# Patient Record
Sex: Female | Born: 1960 | Race: White | Hispanic: No | Marital: Married | Smoking: Never smoker
Health system: Southern US, Community
[De-identification: ages and names within clinical notes are randomized; demographics above are authoritative.]

## PROBLEM LIST (undated history)

## (undated) DIAGNOSIS — Z9889 Other specified postprocedural states: Secondary | ICD-10-CM

## (undated) DIAGNOSIS — I890 Lymphedema, not elsewhere classified: Secondary | ICD-10-CM

## (undated) DIAGNOSIS — E669 Obesity, unspecified: Secondary | ICD-10-CM

## (undated) DIAGNOSIS — F419 Anxiety disorder, unspecified: Secondary | ICD-10-CM

## (undated) DIAGNOSIS — K219 Gastro-esophageal reflux disease without esophagitis: Secondary | ICD-10-CM

## (undated) DIAGNOSIS — G4733 Obstructive sleep apnea (adult) (pediatric): Secondary | ICD-10-CM

## (undated) DIAGNOSIS — N183 Chronic kidney disease, stage 3 unspecified: Secondary | ICD-10-CM

## (undated) DIAGNOSIS — G2581 Restless legs syndrome: Secondary | ICD-10-CM

## (undated) DIAGNOSIS — I499 Cardiac arrhythmia, unspecified: Secondary | ICD-10-CM

## (undated) DIAGNOSIS — Z8619 Personal history of other infectious and parasitic diseases: Secondary | ICD-10-CM

## (undated) DIAGNOSIS — G43909 Migraine, unspecified, not intractable, without status migrainosus: Secondary | ICD-10-CM

## (undated) DIAGNOSIS — Z9989 Dependence on other enabling machines and devices: Secondary | ICD-10-CM

## (undated) DIAGNOSIS — D649 Anemia, unspecified: Secondary | ICD-10-CM

## (undated) DIAGNOSIS — R112 Nausea with vomiting, unspecified: Secondary | ICD-10-CM

## (undated) DIAGNOSIS — E559 Vitamin D deficiency, unspecified: Secondary | ICD-10-CM

## (undated) DIAGNOSIS — R21 Rash and other nonspecific skin eruption: Secondary | ICD-10-CM

## (undated) DIAGNOSIS — R000 Tachycardia, unspecified: Secondary | ICD-10-CM

## (undated) DIAGNOSIS — I10 Essential (primary) hypertension: Secondary | ICD-10-CM

## (undated) HISTORY — PX: FOOT SURGERY: SHX648

## (undated) HISTORY — PX: COLONOSCOPY: SHX174

## (undated) HISTORY — PX: UPPER GI ENDOSCOPY: SHX6162

## (undated) HISTORY — PX: CARDIAC CATHETERIZATION: SHX172

## (undated) HISTORY — PX: BLADDER SUSPENSION: SHX72

## (undated) HISTORY — PX: CHOLECYSTECTOMY: SHX55

## (undated) HISTORY — PX: WISDOM TOOTH EXTRACTION: SHX21

## (undated) HISTORY — PX: TONSILLECTOMY AND ADENOIDECTOMY: SUR1326

---

## 2005-10-13 ENCOUNTER — Ambulatory Visit: Payer: Self-pay | Admitting: Family Medicine

## 2006-10-21 ENCOUNTER — Ambulatory Visit: Payer: Self-pay | Admitting: Obstetrics and Gynecology

## 2007-09-10 ENCOUNTER — Emergency Department: Payer: Self-pay | Admitting: Emergency Medicine

## 2007-10-25 ENCOUNTER — Ambulatory Visit: Payer: Self-pay | Admitting: Obstetrics and Gynecology

## 2008-02-24 ENCOUNTER — Ambulatory Visit: Payer: Self-pay | Admitting: Obstetrics and Gynecology

## 2008-03-02 ENCOUNTER — Ambulatory Visit: Payer: Self-pay | Admitting: Obstetrics and Gynecology

## 2008-10-26 ENCOUNTER — Ambulatory Visit: Payer: Self-pay | Admitting: Obstetrics and Gynecology

## 2009-03-09 ENCOUNTER — Emergency Department: Payer: Self-pay | Admitting: Emergency Medicine

## 2009-10-28 ENCOUNTER — Ambulatory Visit: Payer: Self-pay | Admitting: Obstetrics and Gynecology

## 2009-11-18 ENCOUNTER — Ambulatory Visit: Payer: Self-pay | Admitting: Family Medicine

## 2010-11-06 ENCOUNTER — Ambulatory Visit: Payer: Self-pay | Admitting: Obstetrics and Gynecology

## 2011-12-08 ENCOUNTER — Ambulatory Visit: Payer: Self-pay | Admitting: Obstetrics and Gynecology

## 2013-01-26 ENCOUNTER — Ambulatory Visit: Payer: Self-pay | Admitting: Obstetrics and Gynecology

## 2013-04-24 ENCOUNTER — Ambulatory Visit: Payer: Self-pay | Admitting: Gastroenterology

## 2013-12-13 ENCOUNTER — Ambulatory Visit: Payer: Self-pay | Admitting: Family Medicine

## 2014-03-06 ENCOUNTER — Ambulatory Visit: Payer: Self-pay | Admitting: Obstetrics and Gynecology

## 2016-04-21 ENCOUNTER — Other Ambulatory Visit: Payer: Self-pay | Admitting: Obstetrics & Gynecology

## 2016-04-21 DIAGNOSIS — Z1231 Encounter for screening mammogram for malignant neoplasm of breast: Secondary | ICD-10-CM

## 2016-06-03 ENCOUNTER — Ambulatory Visit
Admission: RE | Admit: 2016-06-03 | Discharge: 2016-06-03 | Disposition: A | Discharge status: Home / Self Care | Payer: BLUE CROSS/BLUE SHIELD | Source: Ambulatory Visit | Attending: Obstetrics & Gynecology | Admitting: Obstetrics & Gynecology

## 2016-06-03 DIAGNOSIS — Z1231 Encounter for screening mammogram for malignant neoplasm of breast: Secondary | ICD-10-CM | POA: Diagnosis not present

## 2017-04-27 ENCOUNTER — Other Ambulatory Visit: Payer: Self-pay | Admitting: Obstetrics & Gynecology

## 2017-04-27 DIAGNOSIS — Z1231 Encounter for screening mammogram for malignant neoplasm of breast: Secondary | ICD-10-CM

## 2017-06-10 ENCOUNTER — Ambulatory Visit
Admission: RE | Admit: 2017-06-10 | Discharge: 2017-06-10 | Disposition: A | Discharge status: Home / Self Care | Payer: BLUE CROSS/BLUE SHIELD | Source: Ambulatory Visit | Attending: Obstetrics & Gynecology | Admitting: Obstetrics & Gynecology

## 2017-06-10 DIAGNOSIS — Z1231 Encounter for screening mammogram for malignant neoplasm of breast: Secondary | ICD-10-CM | POA: Insufficient documentation

## 2017-08-13 ENCOUNTER — Emergency Department: Payer: BLUE CROSS/BLUE SHIELD

## 2017-08-13 ENCOUNTER — Emergency Department
Admission: EM | Admit: 2017-08-13 | Discharge: 2017-08-13 | Disposition: A | Discharge status: Home / Self Care | Payer: BLUE CROSS/BLUE SHIELD | Attending: Emergency Medicine | Admitting: Emergency Medicine

## 2017-08-13 ENCOUNTER — Other Ambulatory Visit: Payer: Self-pay

## 2017-08-13 DIAGNOSIS — R079 Chest pain, unspecified: Secondary | ICD-10-CM | POA: Insufficient documentation

## 2017-08-13 DIAGNOSIS — Z5321 Procedure and treatment not carried out due to patient leaving prior to being seen by health care provider: Secondary | ICD-10-CM | POA: Diagnosis not present

## 2017-08-13 HISTORY — DX: Tachycardia, unspecified: R00.0

## 2017-08-13 HISTORY — DX: Essential (primary) hypertension: I10

## 2017-08-13 LAB — BASIC METABOLIC PANEL
Anion gap: 10 (ref 5–15)
BUN: 18 mg/dL (ref 6–20)
CALCIUM: 9.5 mg/dL (ref 8.9–10.3)
CO2: 24 mmol/L (ref 22–32)
CREATININE: 0.88 mg/dL (ref 0.44–1.00)
Chloride: 105 mmol/L (ref 101–111)
GFR calc non Af Amer: >60 mL/min (ref >60)
Glucose, Bld: 112 mg/dL — ABNORMAL HIGH (ref 65–99)
Potassium: 4.4 mmol/L (ref 3.5–5.1)
SODIUM: 139 mmol/L (ref 135–145)

## 2017-08-13 LAB — CBC
HCT: 43.2 % (ref 35.0–47.0)
Hemoglobin: 14.3 g/dL (ref 12.0–16.0)
MCH: 29 pg (ref 26.0–34.0)
MCHC: 33.1 g/dL (ref 32.0–36.0)
MCV: 87.7 fL (ref 80.0–100.0)
Platelets: 226 10*3/uL (ref 150–440)
RBC: 4.93 MIL/uL (ref 3.80–5.20)
RDW: 14.5 % (ref 11.5–14.5)
WBC: 4.5 10*3/uL (ref 3.6–11.0)

## 2017-08-13 LAB — TROPONIN I: Troponin I: <0.03 ng/mL (ref <0.03)

## 2017-08-13 NOTE — ED Triage Notes (Signed)
Pt to ER via POV c/o left and center chest tightness that started at 0730AM today while walking. Pt denies overly exerting herself. Denies SOB. Denies diaphoresis, nausea. Pt alert and oriented X4, active, cooperative, pt in NAD. RR even and unlabored, color WNL.

## 2017-08-16 ENCOUNTER — Telehealth: Payer: Self-pay | Admitting: Emergency Medicine

## 2017-08-16 NOTE — Telephone Encounter (Signed)
Called patient due to lwot to inquire about condition and follow up plans. Patient says she is going to call her cardiologist at [facility] now to inform of her symptoms from the other day and have him review the labs done here.

## 2018-04-07 ENCOUNTER — Other Ambulatory Visit: Payer: Self-pay | Admitting: Unknown Physician Specialty

## 2018-04-07 DIAGNOSIS — T17908A Unspecified foreign body in respiratory tract, part unspecified causing other injury, initial encounter: Secondary | ICD-10-CM

## 2018-04-07 DIAGNOSIS — R05 Cough: Secondary | ICD-10-CM

## 2018-04-07 DIAGNOSIS — R059 Cough, unspecified: Secondary | ICD-10-CM

## 2018-04-28 ENCOUNTER — Ambulatory Visit
Admission: RE | Admit: 2018-04-28 | Discharge: 2018-04-28 | Disposition: A | Discharge status: Home / Self Care | Payer: BLUE CROSS/BLUE SHIELD | Source: Ambulatory Visit | Attending: Unknown Physician Specialty | Admitting: Unknown Physician Specialty

## 2018-04-28 ENCOUNTER — Other Ambulatory Visit: Payer: Self-pay | Admitting: Obstetrics & Gynecology

## 2018-04-28 DIAGNOSIS — R1310 Dysphagia, unspecified: Secondary | ICD-10-CM

## 2018-04-28 DIAGNOSIS — R05 Cough: Secondary | ICD-10-CM | POA: Insufficient documentation

## 2018-04-28 DIAGNOSIS — Z1231 Encounter for screening mammogram for malignant neoplasm of breast: Secondary | ICD-10-CM

## 2018-04-28 DIAGNOSIS — R059 Cough, unspecified: Secondary | ICD-10-CM

## 2018-04-28 DIAGNOSIS — T17908A Unspecified foreign body in respiratory tract, part unspecified causing other injury, initial encounter: Secondary | ICD-10-CM

## 2018-04-28 NOTE — Therapy (Addendum)
[facility] DI[facility]STIC RADIOLOGY [address] [place], [place], [address] Phone: [phone]   Fax:     Modified Barium Swallow  Patient Details  Name: Barbara Buckley MRN: [id number] Date of Birth: [birth year] No data recorded  Encounter Date: 04/28/2018  End of Session - 04/28/18 1538    Visit Number  1    Number of Visits  1    Date for SLP Re-Evaluation  04/28/18    SLP Start Time  1300    SLP Stop Time   1415    SLP Time Calculation (min)  75 min    Activity Tolerance  Patient tolerated treatment well       Past Medical History:  Diagnosis Date  . Hypertension   . Tachycardia         Lymphedema - per chart        Obesity - per chart   No past surgical history on file.  There were no vitals filed for this visit.          Subjective: Patient behavior: (alertness, ability to follow instructions, etc.): pt A/O x4; verbally conversive and engaged easily w/ SLP following instructions. Pt denied any stroke or Neurological events; no Pulmonary issues or dx of pneumonia. Pt stated she ate a regular diet w/ thin liquids w/ no consistent issues of swallowing - she felt she "aspirated at times". She has had no weight loss or change in her eating habits; no difficulty swallowing Pills. OM exam WFL; native dentition. Chief complaint: dysphagia. Pt reported a dx of GERD, REFLUX "several years ago" but never initiated a PPI. She did change her blood pressure medication recently to another (per MD) d/t concern for the possible side effect of coughing. Pt reported coughing continued.  Pt reported recent period (2 months) of increased coughing around mealtimes; pt feels she may be "aspirating". Upon talking w/ pt during this interview, noted pt frequently cleared her throat and coughed (dry cough) WITHOUT po's present. She endorsed a few s/s of REFLUX especially "moreso at night" she stated. She endorsed Globus feelings. She also stated she  had recently been through a wedding in her family (Son) last month - possibly min increased stress which could be contributing to her s/s of Esophageal phase issues.    Objective:  Radiological Procedure: A videoflouroscopic evaluation of oral-preparatory, reflex initiation, and pharyngeal phases of the swallow was performed; as well as a screening of the upper esophageal phase.  I. POSTURE: upright II. VIEW: lateral; A-P III. COMPENSATORY STRATEGIES: NONE  IV. BOLUSES ADMINISTERED:  Thin Liquid: 5 trials  Nectar-thick Liquid: 2 trials  Honey-thick Liquid: NT  Puree: 2 trials  Mechanical Soft: 4 trials V. RESULTS OF EVALUATION: A. ORAL PREPARATORY PHASE: (The lips, tongue, and velum are observed for strength and coordination)       **Overall Severity Rating: WFL. Pt exhibited adequate bolus management for mastication and timely A-P transfer for swallowing. Full oral clearing noted b/t trials.   B. SWALLOW INITIATION/REFLEX: (The reflex is normal if "triggered" by the time the bolus reached the base of the tongue)  **Overall Severity Rating: [facility]. Timely pharyngeal swallow initiation was noted at the level of BOT-Valleculae w/ adequate airway closure and protection. No laryngeal penetration or aspiration noted to occur.  C. PHARYNGEAL PHASE: (Pharyngeal function is normal if the bolus shows rapid, smooth, and continuous transit through the pharynx and there is no pharyngeal residue after the swallow)  **Overall Severity Rating: [facility]. No  pharyngeal residue remained post swallowing indicating adequate hyolaryngeal excursion and pharyngeal pressure during the swallowing.   D. LARYNGEAL PENETRATION: (Material entering into the laryngeal inlet/vestibule but not aspirated): NONE E. ASPIRATION: NONE F. ESOPHAGEAL PHASE: (Screening of the upper esophagus): noted slow bolus clearing w/ retention of bolus material (puree/soft solid) briefly throughout the Mid-Esophagus (diffuse). Given time and  alternating food trial w/ liquid trial, Esophageal clearing noted. Of note, pt exhibited Min-Mod Dry, coughing post study during conversation after, ~10-15 mins post study.    ASSESSMENT: Pt appeared to present w/ No oropharyngeal phase dysphagia w/ NO aspiration or laryngeal penetration of bolus material during this study. Pt did exhibit apparent Esophageal dysmotility w/ slow clearing of increased textured (food) trials of the Mid-Esophagus often requiring alternating food w/ liquid boluses to aid clearing. This presentation of Esophageal dysmotility could be impact enough to increase pt's Dry, hacking coughing and throat clearing in light of pt's report of s/s of REFLUX and the timing of her s/s.  During the oral phase, pt exhibited adequate bolus management for mastication and timely A-P transfer for swallowing. Full oral clearing noted b/t trials. During the pharyngeal phase, timely pharyngeal swallow initiation was noted at the level of BOT-Valleculae w/ adequate airway closure and protection. No laryngeal penetration or aspiration noted to occur. No pharyngeal residue remained post swallowing indicating adequate hyolaryngeal excursion and pharyngeal pressure during the swallowing. Of note, during the Esophageal phase, slow bolus clearing w/ retention of bolus material (puree/soft solid) briefly throughout the Mid-Esophagus (diffuse) was noted. Given time and alternating food trial w/ liquid trial, Esophageal clearing noted. Of note, pt exhibited Min-Mod Dry, coughing post study during conversation after, ~10-15 mins post study. Thoroughly discussed pt's video/swallowing results and the recommendation to write down when she notices occurrences of her difficulties, when the coughing seems worse, AND to write down details about the foods and her environment. Suggested she discuss this further w/ her PCP w/ potential need for f/u w/ a GI.    PLAN/RECOMMENDATIONS:  A. Diet: Regular diet w/ thin liquids;  Pills w/ water or in a puree if easier for swallowing/clearing  B. Swallowing Precautions: general aspiration precautions; REFLUX precautions  C. Recommended consultation to: GI for formal assessment and management of Esophageal dysmotility; possible Reflux and need for a PPI  D. Therapy recommendations: NONE  E. Results and recommendations were discussed w/ pt; video viewed; questions answered; recommendations and education given re: behavioral strategies during meals        Dysphagia, unspecified type  Cough - Plan: DG Swallowing Func-Speech Pathology, DG Swallowing Func-Speech Pathology        Problem List There are no active problems to display for this patient.     Jerilynn Som, MS, CCC-SLP Watson,Katherine 04/28/2018, 3:40 PM  [facility] DIAGNOSTIC RADIOLOGY [address] [place], [place], [address] Phone: [phone]   Fax:     Name: Barbara Buckley MRN: [id number] Date of Birth: 11-11-1960

## 2018-05-16 ENCOUNTER — Encounter: Payer: Self-pay | Admitting: *Deleted

## 2018-06-13 ENCOUNTER — Ambulatory Visit
Admission: RE | Admit: 2018-06-13 | Discharge: 2018-06-13 | Disposition: A | Discharge status: Home / Self Care | Payer: BLUE CROSS/BLUE SHIELD | Source: Ambulatory Visit | Attending: Obstetrics & Gynecology | Admitting: Obstetrics & Gynecology

## 2018-06-13 ENCOUNTER — Other Ambulatory Visit: Payer: Self-pay

## 2018-06-13 ENCOUNTER — Encounter: Payer: Self-pay | Admitting: Occupational Therapy

## 2018-06-13 ENCOUNTER — Ambulatory Visit: Payer: BLUE CROSS/BLUE SHIELD | Attending: Internal Medicine | Admitting: Occupational Therapy

## 2018-06-13 DIAGNOSIS — I890 Lymphedema, not elsewhere classified: Secondary | ICD-10-CM | POA: Insufficient documentation

## 2018-06-13 DIAGNOSIS — Z1231 Encounter for screening mammogram for malignant neoplasm of breast: Secondary | ICD-10-CM | POA: Diagnosis present

## 2018-06-13 NOTE — Patient Instructions (Signed)

## 2018-06-13 NOTE — Therapy (Signed)
[facility] [address] [place], [place], [address] Phone: [phone]   Fax:  [phone]  Occupational Therapy Evaluation  Patient Details  Name: Barbara ChampagneLee Campbell Buckley MRN: [id number] Date of Birth: Oct 03, 1960 Referring Provider (OT): Domingo Dimeshristele Bahalal-Bock , MD   Encounter Date: 06/13/2018  OT End of Session - 06/13/18 1508    Visit Number  1    Number of Visits  36    Date for OT Re-Evaluation  09/11/18    OT Start Time  0100    OT Stop Time  0215    OT Time Calculation (min)  75 min       Past Medical History:  Diagnosis Date  . Hypertension   . Tachycardia     History reviewed. No pertinent surgical history.  There were no vitals filed for this visit.  Subjective Assessment - 06/13/18 1147    Subjective   Charlotta NewtonLee Campbell Barbara Buckley is referred to Occupational Therapy by Dayna Barkerhristele Behalal-Bock, MD for evaluation and treatment of bilateral lower extremity lymphedema. Pt reports 9onset of leg swelling    Pertinent History  OSA (has CPap, but not sure re compliance); hx chronic leg swelling and pain, HTN, Obesity (BMI- 41.32 Kg/m2); Hx C section x 2; hx tachycardia, Currently exploring gastric bypass    Limitations  decreased standing, walking and extended sitting tolerance , chronic leg pain and swelling, difficulty fitting LE clothing  and footwear, leg pain and swelling limits ability to perform productive activities at work and at home, decreased body image 2/2 leg swelling    Repetition  Increases Symptoms    Patient Stated Goals  decrease leg swelling, reduce leg pain, limit LE progression    Currently in Pain?  Yes    Pain Score  5    0 at best, 10 at worst, 5 on average after work at night.    Pain Location  Leg    Pain Orientation  Right;Left    Pain Descriptors / Indicators  Aching;Guarding;Nagging;Shooting;Tender;Sore;Heaviness;Restless;Tiring    Pain Type  Chronic pain    Pain Onset  --   Pt estimates > 10  years   Pain Frequency  Other (Comment)   Daily   Aggravating Factors   touching legs below the knees, extended  standing, sitting walking, sit to stand transfers    Pain Relieving Factors  nothing    Effect of Pain on Daily Activities  chronic BLE pain and swelling limits basic and instrumental ADLs, productive activities at work, social participation and leisure activities requiring > 30 mins sitting, standing, or walking    Multiple Pain Sites  No        [facility] OT Assessment - 06/13/18 0001      Assessment   Medical Diagnosis  Mild, Stage II, 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   Vocation Requirements  full time standing and sitting    Leisure  family      IADL   Prior Level of Function Shopping  I    Shopping  --   short trips only due to leg pain and swelling   Prior Level of Function Light Housekeeping  I    Light Housekeeping  Does personal laundry completely;Maintains house alone or with occasional assistance;Performs light daily tasks such as dishwashing, bed making    Prior Level of Function Meal Prep  I    Meal Prep  Plans, prepares and serves adequate meals independently   sitting   Prior Level of Function Community  Mobility  I    Education officer, environmental own vehicle      Mobility   Mobility Status  History of falls    Mobility Status Comments  gait appears WNL. No need for device      Written Expression   Dominant Hand  Right      Activity Tolerance   Activity Tolerance  Endurance does not limit participation in activity    Activity Tolerance Comments  leg pain and swelling limits participation in all activities requiring standing, and/or 3walking > 30 minutes, or extended , gravity dependent sitting      Cognition   Overall Cognitive Status  Within Functional Limits for tasks assessed      Posture/Postural Control   Posture/Postural Control  No significant limitations      Sensation   Light Touch  Appears Intact    Proprioception  Appears Intact      Coordination   Gross Motor Movements are Fluid and Coordinated  Yes      ROM / Strength   AROM / PROM / Strength  --   WNL BUE and BLE     Palpation   Palpation comment  Pt tolerated degree of touch necessary for MLD      AROM   Overall AROM   Within functional limits for tasks performed      Strength   Overall Strength  Within functional limits for tasks performed    Overall Strength Comments  Pt reports legs feel fatiqued but not weak      Hand Function   Right Hand Gross Grasp  Functional        Mild, Stage II, BLE LE 2/2 suspected CVI Skin Description Hyper-Keratosis Peau' de Orange Shiny Tight Fibrotic Fatty Doughy Indurated         Fatty fibrosis below knees-mild     Hydration Dry Flaky Erythema Other   Slightly at feet      Color Redness Present Pallor Blanching Hemosiderin Staining Other       Generalized Variscosities    Odor Malodorous Yeast  Absent      x   Temperature Warm Cool wnl     x   Pitting Edema   1+ 2+ 3+ 4+ Non-pitting         x   Girth Symmetrical Asymmetrical Other Distribution    R>L Below knees bilaterally   Stemmer Sign Positive Negative     +  bilaterally    Lymphorrea  History Of:  Present Absent      x   Wounds History Of Present Absent Venous Arterial Pressure Size    denies  x          Signs of Infection Redness Warmth Erythema Acute Swelling Drainage Borders   absent                Scars Adhesions Hypersensitivity        Sensation Light Touch Deep pressure Hypersensitivty   Present Impaired Present Impaired Absent Impaired   x   x  x   x  Nails WNL Fungus Present Other   x     Hair Growth Symmetrical Asymmetrical    R>L   Skin Creases Base of toes Ankle Base of Finger Medial Thigh         X slight                       OT Education - 06/13/18 1145    Education Details  Provided Pt and family skilled education regarding lymphatic structure and function, lymphedema etiologies, onset patterns and stages of progression. Discussed  impact of obesity on lymphatic system function. Outlined Complete Decongestive Therapy (CDT)  as standard of LE care and provided in depth information regarding 4 primary components of both Intensive and Self Management Phases, including Manual Lymph Drainage (MLD), compression wrapping and garments, skin care, and therapeutic exercise.   Discussed   Importance of daily, ongoing LE self-care essential to retaining clinical gains and limiting progression.  Lastly, reviewed lymphedema precautions, including cellulitis risk and difficulty with wound healing. Provided printed Lymphedema Workbook for reference.    Person(s) Educated  Patient    Methods  Explanation;Demonstration    Comprehension  Verbalized understanding;Returned demonstration          OT Long Term Goals - 06/13/18 1526      OT LONG TERM GOAL #1   Title  Pt will demonstrate understanding of lymphedema (LE) precautions / prevention principals, including signs / symptoms of cellulitis infection with modified independence using LE Workbook as printed reference to identify 6 precautions without verbal cues by end of 3rd  OT Rx visit.      Baseline  Max A    Time  3    Period  Days    Status  New    Target Date  --   3rd OT visit     OT LONG TERM GOAL #2   Title  Pt will be able to independently  apply knee-length, multi-layer, short stretch compression wraps using correct gradient techniques to achieve optimal limb volume reduction during Intensive Phase CDT, and to return affected limb(s) , as closely as possible, to premorbid size and shape.    Baseline  Dependent    Time  4    Period  Days    Status  New    Target Date  --   4th OT visit     OT LONG TERM GOAL #3   Title  Pt to achieve no less than 10% limb volume reduction in affected limb(s)  bilaterally during Intensive Phase CDT to control limb swelling, to improve tissue integrity and immune function, to improve ADLs performance and to improve functional mobility/ transfer, and to improve body image and self-esteem.    Baseline  Max A    Time  12    Period  Weeks    Status  New  Target Date  09/11/18      OT LONG TERM GOAL #4   Time  12    Period  Weeks    Status  New    Target Date  09/11/18      OT LONG TERM GOAL #5   Title  Pt will achieve 100% compliance with daily LE self-care home program components, including daily  skin care, simple self-MLD, gradient compression wraps/ compression garments/devices, and therapeutic exercise to ensure optimal Intensive Phase limb volume reduction to expedite compression garment/ device fitting.    Baseline  Max A    Time  12    Period  Weeks    Status  New    Target Date  09/11/18      Long Term Additional Goals   Additional Long Term Goals  Yes      OT LONG TERM GOAL #6   Title  Pt will retain optimal limb volume reductions achieved during Intensive Phase CDT with no more than 3% volume increase with ongoing CG assistance to limit LE progression and further functional decline.    Baseline  Max A    Time  6    Period  Months    Status  New    Target Date  12/10/18            Plan - 06/13/18  1540    Clinical Impression Statement  Lealer Marsland is a 58 yo  female presenting with chronic, progressive, mild, stage II, BLE lymphedema (LE) secondary to suspected CVI and obesity.  Systemic factors also  impacting LE  include fluid retention 2/2 HTN  and OSA (cpap compliance?). Lymphatic leg  swelling and associated pain and discomfort limits Mrs. Covell ability to perform functional activities in all occupational domains, including basic and instrumental ADLs, productive activities at work and  leisure pursuits. Leg pain and swelling impairs  body image, limits  social participation and impairs transfer and, ambulation. Mrs Yoshida will benefit from skilled Occupational Therapy for Complete Decongestive Therapy (CDT) to include manual lymphatic drainage, skin care, therapeutic exercise and compression therapy to reduce leg swelling and chronic pain, to reduce infection risk and to reduce LE progression. Emphasis throughout OT course will be on Pt education to facilitate long term  LE self-management. Without skilled OT for CDT LE will progress resulting in worsening condition, ongoing infection risk and further functional decline.    Occupational performance deficits (Please refer to evaluation for details):  ADL's;IADL's;Rest and Sleep;Work;Leisure;Social Participation;Other   body image   Rehab Potential  Good    OT Frequency  3x / week    OT Duration  12 weeks    OT Treatment/Interventions  Self-care/ADL training;Therapeutic exercise;Manual lymph drainage;Therapeutic activities;Coping strategies training;DME and/or AE instruction;Manual Therapy;Patient/family education    Clinical Decision Making  Several treatment options, min-mod task modification necessary    Recommended Other Services  consider trial in clinic with Flexitouch 32-chamber sequential, pneumati ,compression device with Cardiology [place], Fit with flat knit, custom , knee length cmopression garments- consider Elvarex Soft ccl  3 ( 35-45 mmHg)Consider Wlvarex RELAX for HOS     Consulted and Agree with Plan of Care  Patient       Patient will benefit from skilled therapeutic intervention in order to improve the following deficits and impairments:  Decreased activity tolerance, Decreased knowledge of use of DME, Decreased skin integrity, Increased edema, Pain, Obesity, Decreased coping skills, Impaired perceived functional ability  Visit Diagnosis: Lymphedema, not elsewhere classified -  Plan: Ot plan of care cert/re-cert    Problem List There are no active problems to display for this patient.   Loel Dubonnetheresa Cythia Bachtel, MS, OTR/L, [facility] 06/13/18 3:54 PM]  [facility] [address] [place], [place], [address] Phone: [phone]   Fax:  [phone]  Name: Barbara ChampagneLee Campbell Swenor MRN: [id number] Date of Birth: [birth year]

## 2018-06-15 ENCOUNTER — Ambulatory Visit: Payer: BLUE CROSS/BLUE SHIELD | Admitting: Occupational Therapy

## 2018-06-15 DIAGNOSIS — I890 Lymphedema, not elsewhere classified: Secondary | ICD-10-CM

## 2018-06-15 NOTE — Patient Instructions (Signed)

## 2018-06-15 NOTE — Therapy (Signed)
[name] [facility] [address] [place], [place], [address] Phone: [phone]   Fax:  [phone]  Occupational Therapy Treatment  Patient Details  Name: Barbara Buckley MRN: [id number] Date of Birth: [birth year] Referring Provider (OT): Domingo Dimeshristele Bahalal-Bock , MD   Encounter Date: 06/15/2018  OT End of Session - 06/15/18 1045    Visit Number  2    Number of Visits  36    Date for OT Re-Evaluation  09/11/18    OT Start Time  0915    OT Stop Time  1015    OT Time Calculation (min)  60 min    Activity Tolerance  Patient tolerated treatment well;No increased pain    Behavior During Therapy  WFL for tasks assessed/performed       Past Medical History:  Diagnosis Date  . Hypertension   . Tachycardia     No past surgical history on file.  There were no vitals filed for this visit.  Subjective Assessment - 06/15/18 0942    Subjective   Barbara Buckley is here today for OT treatment visit 2/36 to address BLE lymphedema. Pt has no new complaints. Legs feel tender and sore this morning.    Pertinent History  OSA (has CPap, but not sure re compliance); hx chronic leg swelling and pain, HTN, Obesity (BMI- 41.32 Kg/m2); Hx C section x 2; hx tachycardia, Currently exploring gastric bypass    Limitations  decreased standing, walking and extended sitting tolerance , chronic leg pain and swelling, difficulty fitting LE clothing  and footwear, leg pain and swelling limits ability to perform productive activities at work and at home, decreased body image 2/2 leg swelling    Repetition  Increases Symptoms    Patient Stated Goals  decrease leg swelling, reduce leg pain, limit LE progression    Currently in Pain?  Yes    Pain Score  3     Pain Location  Leg    Pain Orientation  Right;Left    Pain Descriptors / Indicators  Tender;Discomfort;Tiring    Pain Type  Chronic pain    Pain Onset  --   Pt estimates > 10 years          LYMPHEDEMA/ONCOLOGY QUESTIONNAIRE - 06/15/18 1033      Lymphedema Assessments   Lymphedema Assessments  Lower extremities      Right Lower Extremity Lymphedema   Other  RLE (dominant) A-D (ankle to tibial tuberosity) limb volume measures 6600.31 ml.    Other  LVD measures 32.4%, R>L      Left Lower Extremity Lymphedema   Other  LLE  A-D (ankle to tibial tuberosity) limb volume measures 6443.98 ml.              OT Treatments/Exercises (OP) - 06/15/18 0001      ADLs   ADL Education Given  Yes      Manual Therapy   Manual Therapy  Edema management;Compression Bandaging    Edema Management  Baseline BLE limb volumetrics.    Compression Bandaging  Short stretch, gradient compression wrap using multiple layers over Rosidal foam              OT Education - 06/15/18 1041    Education Details  Pt edu for differences between short and long stretch compression wraps, and rational for using gradient compression configuration with SS wraps forLE care. Provided nitro level compression wrap tutorial w/ demo only today, Providfed Pt edu for volumetrics completed today  as meas of measurement for progress towards limb reduction goals.    Person(s) Educated  Patient    Methods  Explanation;Demonstration;Verbal cues;Handout    Comprehension  Verbalized understanding;Returned demonstration;Need further instruction          OT Long Term Goals - 06/13/18 1526      OT LONG TERM GOAL #1   Title  Pt will demonstrate understanding of lymphedema (LE) precautions / prevention principals, including signs / symptoms of cellulitis infection with modified independence using LE Workbook as printed reference to identify 6 precautions without verbal cues by end of 3rd  OT Rx visit.     Baseline  Max A    Time  3    Period  Days    Status  New    Target Date  --   3rd OT visit     OT LONG TERM GOAL #2   Title  Pt will be able to independently  apply knee-length, multi-layer,  short stretch compression wraps using correct gradient techniques to achieve optimal limb volume reduction during Intensive Phase CDT, and to return affected limb(s) , as closely as possible, to premorbid size and shape.    Baseline  Dependent    Time  4    Period  Days    Status  New    Target Date  --   4th OT visit     OT LONG TERM GOAL #3   Title  Pt to achieve no less than 10% limb volume reduction in affected limb(s)  bilaterally during Intensive Phase CDT to control limb swelling, to improve tissue integrity and immune function, to improve ADLs performance and to improve functional mobility/ transfer, and to improve body image and self-esteem.    Baseline  Max A    Time  12    Period  Weeks    Status  New    Target Date  09/11/18      OT LONG TERM GOAL #4   Time  12    Period  Weeks    Status  New    Target Date  09/11/18      OT LONG TERM GOAL #5   Title  Pt will achieve 100% compliance with daily LE self-care home program components, including daily  skin care, simple self-MLD, gradient compression wraps/ compression garments/devices, and therapeutic exercise to ensure optimal Intensive Phase limb volume reduction to expedite compression garment/ device fitting.    Baseline  Max A    Time  12    Period  Weeks    Status  New    Target Date  09/11/18      Long Term Additional Goals   Additional Long Term Goals  Yes      OT LONG TERM GOAL #6   Title  Pt will retain optimal limb volume reductions achieved during Intensive Phase CDT with no more than 3% volume increase with ongoing CG assistance to limit LE progression and further functional decline.    Baseline  Max A    Time  6    Period  Months    Status  New    Target Date  12/10/18            Plan - 06/15/18 1046    Clinical Impression Statement  Provided Pt edu for differences between short and long stretch compression wraps, and rational for using gradient compression configuration with SS wraps for LE  care. Provided nitro level compression wrap tutorial w/ demo  only today, Providfd Pt edu for volumetrics completed today as meas of measurement for progress towards limb reduction goals. RLE (dominant) A-D (ankle to tibial tuberosity) limb volume measures 6600.31 ml. LLE  A-D (ankle to tibial tuberosity) limb volume measures 6443.98 ml. Baseline limb volume differential for legs A-D = 2.4%, R (dom) > L. This value is typical of WNL for LVD; however, what this value doesn't capture is essentially symmetrical swelling in both legs below the knees with RLE mildly more swollen anteriorly than L. Cont as per POC. Provide in depth training for self wrapping and ther ex next session.    Occupational performance deficits (Please refer to evaluation for details):  ADL's;IADL's;Rest and Sleep;Work;Leisure;Social Participation;Other   body image   Rehab Potential  Good    OT Frequency  3x / week    OT Duration  12 weeks    OT Treatment/Interventions  Self-care/ADL training;Therapeutic exercise;Manual lymph drainage;Therapeutic activities;Coping strategies training;DME and/or AE instruction;Manual Therapy;Patient/family education    Clinical Decision Making  Several treatment options, min-mod task modification necessary    Recommended Other Services  consider trial in clinic with Flexitouch 32-chamber sequential, pneumati ,compression device with Cardiology [place], Fit with flat knit, custom , knee length cmopression garments- consider Elvarex Soft ccl 3 ( 35-45 mmHg)Consider Wlvarex RELAX for HOS     Consulted and Agree with Plan of Care  Patient       Patient will benefit from skilled therapeutic intervention in order to improve the following deficits and impairments:  Decreased activity tolerance, Decreased knowledge of use of DME, Decreased skin integrity, Increased edema, Pain, Obesity, Decreased coping skills, Impaired perceived functional ability  Visit Diagnosis: Lymphedema, not elsewhere  classified    Problem List There are no active problems to display for this patient.  Loel Dubonnet, MS, OTR/L, [facility] 06/15/18 10:48 AM  [place] [facility] MAIN [facility] SERVICES [address] [place], [place], [address] Phone: [phone]   Fax:  [phone]  Name: Barbara Buckley MRN: [id number] Date of Birth: [birth year]

## 2018-06-21 ENCOUNTER — Ambulatory Visit: Payer: BLUE CROSS/BLUE SHIELD | Admitting: Occupational Therapy

## 2018-06-21 ENCOUNTER — Encounter: Payer: BLUE CROSS/BLUE SHIELD | Admitting: Occupational Therapy

## 2018-06-21 DIAGNOSIS — I890 Lymphedema, not elsewhere classified: Secondary | ICD-10-CM | POA: Diagnosis not present

## 2018-06-21 NOTE — Patient Instructions (Signed)

## 2018-06-21 NOTE — Therapy (Signed)
[place] MAIN [facility] SERVICES [address] [place], [place], [address] Phone: [phone]   Fax:  [phone]  Occupational Therapy Treatment  Patient Details  Name: Barbara Buckley MRN: [id number] Date of Birth: [birth year] Referring Provider (OT): Floy Sabina , MD   Encounter Date: 06/21/2018  OT End of Session - 06/21/18 0957    Visit Number  3    Number of Visits  36    Date for OT Re-Evaluation  09/11/18    OT Start Time  0800    OT Stop Time  0910    OT Time Calculation (min)  70 min    Activity Tolerance  Patient tolerated treatment well;No increased pain    Behavior During Therapy  WFL for tasks assessed/performed       Past Medical History:  Diagnosis Date  . Hypertension   . Tachycardia     No past surgical history on file.  There were no vitals filed for this visit.  Subjective Assessment - 06/21/18 0949    Subjective   Barbara Buckley is here today for OT treatment visit 3/36 to address BLE lymphedema. Pt reports she was able to tolerate compression wraps for 24 hours after last visit without difficulty. "In fact, they felt good. I'd like to go ahead and wrap both legs to speed this up if it's OK."    Pertinent History  OSA (has CPap, but not sure re compliance); hx chronic leg swelling and pain, HTN, Obesity (BMI- 41.32 Kg/m2); Hx C section x 2; hx tachycardia, Currently exploring gastric bypass    Limitations  decreased standing, walking and extended sitting tolerance , chronic leg pain and swelling, difficulty fitting LE clothing  and footwear, leg pain and swelling limits ability to perform productive activities at work and at home, decreased body image 2/2 leg swelling    Repetition  Increases Symptoms    Patient Stated Goals  decrease leg swelling, reduce leg pain, limit LE progression    Currently in Pain?  Yes   not rated numerically. Leg change unchanged from initia;l eval.   Pain Location  Leg    Pain  Orientation  Left;Right    Pain Descriptors / Indicators  Tender;Sore;Pressure;Discomfort;Heaviness;Tightness;Tiring    Pain Type  Chronic pain    Pain Onset  --   Pt estimates > 10 years                  OT Treatments/Exercises (OP) - 06/21/18 0001      ADLs   ADL Education Given  Yes      Manual Therapy   Manual Therapy  Edema management;Manual Lymphatic Drainage (MLD);Compression Bandaging    Manual therapy comments  skin care to RLE during MLD with low ph Castor Oil [facility]) to improve skin hydration and flexibility    Manual Lymphatic Drainage (MLD)  Commenced MLD to RLE utilizing short neck sequence, deep abdominal breathing and functional inguinal LN. Good tolerance.    Compression Bandaging  Applied BLE, knee length, short stretch wraps as established. Replaced Rosidal foam as it was more compressed than usual. Will contact DME vendor if problem persists.             OT Education - 06/21/18 0955    Education Details  Provided skilled Pt edu for intro level MLD w/ emphasis on structure and function of lymphatic system. Continued Pt edu  for gradient compression wrapping. Pt fully engaged in all aspects of self care training.  Person(s) Educated  Patient    Methods  Explanation;Demonstration;Verbal cues;Handout    Comprehension  Verbalized understanding;Returned demonstration;Need further instruction          OT Long Term Goals - 06/21/18 1001      OT LONG TERM GOAL #1   Title  Pt will demonstrate understanding of lymphedema (LE) precautions / prevention principals, including signs / symptoms of cellulitis infection with modified independence using LE Workbook as printed reference to identify 6 precautions without verbal cues by end of 3rd  OT Rx visit.     Baseline  Max A    Time  3    Period  Days    Status  Partially Met      OT LONG TERM GOAL #2   Title  Pt will be able to independently  apply knee-length, multi-layer, short stretch  compression wraps using correct gradient techniques to achieve optimal limb volume reduction during Intensive Phase CDT, and to return affected limb(s) , as closely as possible, to premorbid size and shape.    Baseline  Dependent 06/21/2018 . Goal met. Pt has excellent technique and is quick study.    Time  4    Period  Days    Status  Achieved      OT LONG TERM GOAL #3   Title  Pt to achieve no less than 10% limb volume reduction in affected limb(s)  bilaterally during Intensive Phase CDT to control limb swelling, to improve tissue integrity and immune function, to improve ADLs performance and to improve functional mobility/ transfer, and to improve body image and self-esteem.    Baseline  Max A    Time  12    Period  Weeks    Status  New      OT LONG TERM GOAL #4   Time  12    Period  Weeks    Status  New      OT LONG TERM GOAL #5   Title  Pt will achieve 100% compliance with daily LE self-care home program components, including daily  skin care, simple self-MLD, gradient compression wraps/ compression garments/devices, and therapeutic exercise to ensure optimal Intensive Phase limb volume reduction to expedite compression garment/ device fitting.    Baseline  Max A    Time  12    Period  Weeks    Status  New      OT LONG TERM GOAL #6   Title  Pt will retain optimal limb volume reductions achieved during Intensive Phase CDT with no more than 3% volume increase with ongoing CG assistance to limit LE progression and further functional decline.    Baseline  Max A    Time  6    Period  Months    Status  New            Plan - 06/21/18 0957    Clinical Impression Statement  Pt has mastered knee length, multilevel compression wrapping. Goal met. Pt having no difficulty tolerating compression wraps and relays that they reduce leg discomfort. _Pt participated in all aspects of self care training for simple self MLD. Limb volume reduction not yet visible in RLE. Cont as epr POC.     Occupational performance deficits (Please refer to evaluation for details):  ADL's;IADL's;Rest and Sleep;Work;Leisure;Social Participation;Other   body image   Rehab Potential  Good    OT Frequency  3x / week    OT Duration  12 weeks    OT Treatment/Interventions  Self-care/ADL  training;Therapeutic exercise;Manual lymph drainage;Therapeutic activities;Coping strategies training;DME and/or AE instruction;Manual Therapy;Patient/family education    Clinical Decision Making  Several treatment options, min-mod task modification necessary    Recommended Other Services  consider trial in clinic with Flexitouch 32-chamber sequential, pneumati ,compression device with Cardiology [place], Fit with flat knit, custom , knee length cmopression garments- consider Elvarex Soft ccl 3 ( 35-45 mmHg)Consider Wlvarex RELAX for HOS     Consulted and Agree with Plan of Care  Patient       Patient will benefit from skilled therapeutic intervention in order to improve the following deficits and impairments:  Decreased activity tolerance, Decreased knowledge of use of DME, Decreased skin integrity, Increased edema, Pain, Obesity, Decreased coping skills, Impaired perceived functional ability  Visit Diagnosis: Lymphedema, not elsewhere classified    Problem List There are no active problems to display for this patient.   Andrey Spearman, MS, OTR/L, [facility] 06/21/18 10:03 AM  [place] MAIN [facility] SERVICES [place], [place], [address] Phone: [phone]   Fax:  [phone]  Name: Lashaun Poch MRN: [id number] Date of Birth: Oct 10, 1960

## 2018-06-23 ENCOUNTER — Ambulatory Visit: Payer: BLUE CROSS/BLUE SHIELD | Admitting: Occupational Therapy

## 2018-06-23 ENCOUNTER — Encounter: Payer: BLUE CROSS/BLUE SHIELD | Admitting: Occupational Therapy

## 2018-06-23 DIAGNOSIS — I890 Lymphedema, not elsewhere classified: Secondary | ICD-10-CM

## 2018-06-23 NOTE — Therapy (Signed)
[place] MAIN [facility] SERVICES [address] [place], [place], [address] Phone: [phone]   Fax:  [phone]  Occupational Therapy Treatment  Patient Details  Name: Barbara Buckley MRN: [id number] Date of Birth: 10/30/1960 Referring Provider (OT): Floy Sabina , MD   Encounter Date: 06/23/2018  OT End of Session - 06/23/18 1236    Visit Number  4    Number of Visits  36    Date for OT Re-Evaluation  09/11/18    OT Start Time  1115    OT Stop Time  1229    OT Time Calculation (min)  74 min    Activity Tolerance  Patient tolerated treatment well;No increased pain    Behavior During Therapy  WFL for tasks assessed/performed       Past Medical History:  Diagnosis Date  . Hypertension   . Tachycardia     No past surgical history on file.  There were no vitals filed for this visit.  Subjective Assessment - 06/23/18 1230    Subjective   Barbara Buckley is here today for OT treatment visit 4/36 to address BLE lymphedema. Pt reports she has been 100% compliant with compression wrapping BLE without difficulty since last visit. "They feel really good."    Pertinent History  OSA (has CPap, but not sure re compliance); hx chronic leg swelling and pain, HTN, Obesity (BMI- 41.32 Kg/m2); Hx C section x 2; hx tachycardia, Currently exploring gastric bypass    Limitations  decreased standing, walking and extended sitting tolerance , chronic leg pain and swelling, difficulty fitting LE clothing  and footwear, leg pain and swelling limits ability to perform productive activities at work and at home, decreased body image 2/2 leg swelling    Repetition  Increases Symptoms    Patient Stated Goals  decrease leg swelling, reduce leg pain, limit LE progression    Currently in Pain?  --   unchanged since initial eval. Not rated numerically today.   Pain Location  Leg    Pain Orientation  Right;Left    Pain Descriptors / Indicators   Aching;Tender;Sore;Pressure;Discomfort;Heaviness;Tightness;Tiring    Pain Type  Chronic pain    Pain Onset  --   Pt estimates > 10 years                  OT Treatments/Exercises (OP) - 06/23/18 0001      ADLs   ADL Comments  Pt edu for LE self care throughout session- emphasis on structure and function fof lymphatic system  relative to MLD and lymphatic pumping therex    ADL Education Given  Yes      Manual Therapy   Manual Therapy  Edema management;Manual Lymphatic Drainage (MLD);Compression Bandaging    Manual therapy comments  skin care to RLE during MLD with low ph Castor Oil [facility]) to improve skin hydration and flexibility    Manual Lymphatic Drainage (MLD)  MLD to LLE as established utilizing functional inguinal LN and deep lymphatic pathways in abdomen to reduce limb swelling, reduce  discomfort, improve functional ambulation and standing tolerance, and limit LE progression.    Compression Bandaging  Applied BLE, knee length, short stretch wraps as established. Replaced Rosidal foam as it was more compressed than usual. Will contact DME vendor if problem persists.             OT Education - 06/23/18 1235    Education Details  Pt edu for simple self-MLDF and lymphatic pumping therex- intro  level w dem0nstrations    Person(s) Educated  Patient    Methods  Explanation;Demonstration;Verbal cues;Handout    Comprehension  Verbalized understanding;Returned demonstration;Need further instruction          OT Long Term Goals - 06/21/18 1001      OT LONG TERM GOAL #1   Title  Pt will demonstrate understanding of lymphedema (LE) precautions / prevention principals, including signs / symptoms of cellulitis infection with modified independence using LE Workbook as printed reference to identify 6 precautions without verbal cues by end of 3rd  OT Rx visit.     Baseline  Max A    Time  3    Period  Days    Status  Partially Met      OT LONG TERM GOAL #2    Title  Pt will be able to independently  apply knee-length, multi-layer, short stretch compression wraps using correct gradient techniques to achieve optimal limb volume reduction during Intensive Phase CDT, and to return affected limb(s) , as closely as possible, to premorbid size and shape.    Baseline  Dependent 06/21/2018 . Goal met. Pt has excellent technique and is quick study.    Time  4    Period  Days    Status  Achieved      OT LONG TERM GOAL #3   Title  Pt to achieve no less than 10% limb volume reduction in affected limb(s)  bilaterally during Intensive Phase CDT to control limb swelling, to improve tissue integrity and immune function, to improve ADLs performance and to improve functional mobility/ transfer, and to improve body image and self-esteem.    Baseline  Max A    Time  12    Period  Weeks    Status  New      OT LONG TERM GOAL #4   Time  12    Period  Weeks    Status  New      OT LONG TERM GOAL #5   Title  Pt will achieve 100% compliance with daily LE self-care home program components, including daily  skin care, simple self-MLD, gradient compression wraps/ compression garments/devices, and therapeutic exercise to ensure optimal Intensive Phase limb volume reduction to expedite compression garment/ device fitting.    Baseline  Max A    Time  12    Period  Weeks    Status  New      OT LONG TERM GOAL #6   Title  Pt will retain optimal limb volume reductions achieved during Intensive Phase CDT with no more than 3% volume increase with ongoing CG assistance to limit LE progression and further functional decline.    Baseline  Max A    Time  6    Period  Months    Status  New            Plan - 06/23/18 1236    Clinical Impression Statement  Pt did great job with compression wraps between sessions. Minimal limb volume reduction noted today. Pin level unchanged by report. Pt tolerates MLD, skin care, edu and compression therapy without difficulty today. Cont as per  POC.    Occupational performance deficits (Please refer to evaluation for details):  ADL's;IADL's;Rest and Sleep;Work;Leisure;Social Participation;Other   body image   Rehab Potential  Good    OT Frequency  3x / week    OT Duration  12 weeks    OT Treatment/Interventions  Self-care/ADL training;Therapeutic exercise;Manual lymph drainage;Therapeutic activities;Coping strategies training;DME  and/or AE instruction;Manual Therapy;Patient/family education    Clinical Decision Making  Several treatment options, min-mod task modification necessary    Recommended Other Services  consider trial in clinic with Flexitouch 32-chamber sequential, pneumati ,compression device with Cardiology [place], Fit with flat knit, custom , knee length cmopression garments- consider Elvarex Soft ccl 3 ( 35-45 mmHg)Consider Wlvarex RELAX for HOS     Consulted and Agree with Plan of Care  Patient       Patient will benefit from skilled therapeutic intervention in order to improve the following deficits and impairments:  Decreased activity tolerance, Decreased knowledge of use of DME, Decreased skin integrity, Increased edema, Pain, Obesity, Decreased coping skills, Impaired perceived functional ability  Visit Diagnosis: Lymphedema, not elsewhere classified    Problem List There are no active problems to display for this patient.   Andrey Spearman, MS, OTR/L, [facility] 06/23/18 12:39 PM  [place] MAIN [facility] SERVICES [address] [place], [place], [address] Phone: [phone]   Fax:  [phone]  Name: Antoniette Peake MRN: [id number] Date of Birth: 15-Oct-1960

## 2018-06-23 NOTE — Therapy (Signed)
[place] MAIN [facility] SERVICES [address] [place], [place], [address] Phone: [phone]   Fax:  [phone]  Occupational Therapy Treatment  Patient Details  Name: Barbara Buckley MRN: [id number] Date of Birth: [birth year] Referring Provider (OT): Floy Sabina , MD   Encounter Date: 06/23/2018  OT End of Session - 06/23/18 1236    Visit Number  4    Number of Visits  36    Date for OT Re-Evaluation  09/11/18    OT Start Time  1115    OT Stop Time  1229    OT Time Calculation (min)  74 min    Activity Tolerance  Patient tolerated treatment well;No increased pain    Behavior During Therapy  WFL for tasks assessed/performed       Past Medical History:  Diagnosis Date  . Hypertension   . Tachycardia     No past surgical history on file.  There were no vitals filed for this visit.  Subjective Assessment - 06/23/18 1230    Subjective   Barbara Buckley is here today for OT treatment visit 4/36 to address BLE lymphedema. Pt reports she has been 100% compliant with compression wrapping BLE without difficulty since last visit. "They feel really good."    Pertinent History  OSA (has CPap, but not sure re compliance); hx chronic leg swelling and pain, HTN, Obesity (BMI- 41.32 Kg/m2); Hx C section x 2; hx tachycardia, Currently exploring gastric bypass    Limitations  decreased standing, walking and extended sitting tolerance , chronic leg pain and swelling, difficulty fitting LE clothing  and footwear, leg pain and swelling limits ability to perform productive activities at work and at home, decreased body image 2/2 leg swelling    Repetition  Increases Symptoms    Patient Stated Goals  decrease leg swelling, reduce leg pain, limit LE progression    Currently in Pain?  --   unchanged since initial eval. Not rated numerically today.   Pain Location  Leg    Pain Orientation  Right;Left    Pain Descriptors / Indicators   Aching;Tender;Sore;Pressure;Discomfort;Heaviness;Tightness;Tiring    Pain Type  Chronic pain    Pain Onset  --   Pt estimates > 10 years        [facility] OT Assessment - 06/23/18 0001      Observation/Other Assessments   Outcome Measures  Baseline LE Life Impact Scale (LLIS) score for received level of disbility 2/2 LE in the past week = 35%               OT Treatments/Exercises (OP) - 06/23/18 0001      ADLs   ADL Comments  Pt edu for LE self care throughout session- emphasis on structure and function fof lymphatic system  relative to MLD and lymphatic pumping therex    ADL Education Given  Yes      Manual Therapy   Manual Therapy  Edema management;Manual Lymphatic Drainage (MLD);Compression Bandaging    Manual therapy comments  skin care to RLE during MLD with low ph Castor Oil [facility]) to improve skin hydration and flexibility    Manual Lymphatic Drainage (MLD)  MLD to LLE as established utilizing functional inguinal LN and deep lymphatic pathways in abdomen to reduce limb swelling, reduce  discomfort, improve functional ambulation and standing tolerance, and limit LE progression.    Compression Bandaging  Applied BLE, knee length, short stretch wraps as established. Replaced Rosidal foam as it was more compressed  than usual. Will contact DME vendor if problem persists.             OT Education - 06/23/18 1235    Education Details  Pt edu for simple self-MLDF and lymphatic pumping therex- intro level w dem0nstrations    Person(s) Educated  Patient    Methods  Explanation;Demonstration;Verbal cues;Handout    Comprehension  Verbalized understanding;Returned demonstration;Need further instruction          OT Long Term Goals - 06/21/18 1001      OT LONG TERM GOAL #1   Title  Pt will demonstrate understanding of lymphedema (LE) precautions / prevention principals, including signs / symptoms of cellulitis infection with modified independence using LE Workbook  as printed reference to identify 6 precautions without verbal cues by end of 3rd  OT Rx visit.     Baseline  Max A    Time  3    Period  Days    Status  Partially Met      OT LONG TERM GOAL #2   Title  Pt will be able to independently  apply knee-length, multi-layer, short stretch compression wraps using correct gradient techniques to achieve optimal limb volume reduction during Intensive Phase CDT, and to return affected limb(s) , as closely as possible, to premorbid size and shape.    Baseline  Dependent 06/21/2018 . Goal met. Pt has excellent technique and is quick study.    Time  4    Period  Days    Status  Achieved      OT LONG TERM GOAL #3   Title  Pt to achieve no less than 10% limb volume reduction in affected limb(s)  bilaterally during Intensive Phase CDT to control limb swelling, to improve tissue integrity and immune function, to improve ADLs performance and to improve functional mobility/ transfer, and to improve body image and self-esteem.    Baseline  Max A    Time  12    Period  Weeks    Status  New      OT LONG TERM GOAL #4   Time  12    Period  Weeks    Status  New      OT LONG TERM GOAL #5   Title  Pt will achieve 100% compliance with daily LE self-care home program components, including daily  skin care, simple self-MLD, gradient compression wraps/ compression garments/devices, and therapeutic exercise to ensure optimal Intensive Phase limb volume reduction to expedite compression garment/ device fitting.    Baseline  Max A    Time  12    Period  Weeks    Status  New      OT LONG TERM GOAL #6   Title  Pt will retain optimal limb volume reductions achieved during Intensive Phase CDT with no more than 3% volume increase with ongoing CG assistance to limit LE progression and further functional decline.    Baseline  Max A    Time  6    Period  Months    Status  New            Plan - 06/23/18 1236    Clinical Impression Statement  Pt did great job with  compression wraps between sessions. Minimal limb volume reduction noted today. Pin level unchanged by report. Pt tolerates MLD, skin care, edu and compression therapy without difficulty today. Cont as per POC.    Occupational performance deficits (Please refer to evaluation for details):  ADL's;IADL's;Rest and Sleep;Work;Leisure;Social  Participation;Other   body image   Rehab Potential  Good    OT Frequency  3x / week    OT Duration  12 weeks    OT Treatment/Interventions  Self-care/ADL training;Therapeutic exercise;Manual lymph drainage;Therapeutic activities;Coping strategies training;DME and/or AE instruction;Manual Therapy;Patient/family education    Clinical Decision Making  Several treatment options, min-mod task modification necessary    Recommended Other Services  consider trial in clinic with Flexitouch 32-chamber sequential, pneumati ,compression device with Cardiology [place], Fit with flat knit, custom , knee length cmopression garments- consider Elvarex Soft ccl 3 ( 35-45 mmHg)Consider Wlvarex RELAX for HOS     Consulted and Agree with Plan of Care  Patient       Patient will benefit from skilled therapeutic intervention in order to improve the following deficits and impairments:  Decreased activity tolerance, Decreased knowledge of use of DME, Decreased skin integrity, Increased edema, Pain, Obesity, Decreased coping skills, Impaired perceived functional ability  Visit Diagnosis: Lymphedema, not elsewhere classified    Problem List There are no active problems to display for this patient.   Ansel Bong 06/23/2018, 2:00 PM  [place] MAIN [facility] SERVICES [address] [place], [place], [address] Phone: [phone]   Fax:  [phone]  Name: Barbara Buckley MRN: [id number] Date of Birth: 1961/01/17

## 2018-06-24 ENCOUNTER — Other Ambulatory Visit: Payer: Self-pay | Admitting: Surgery

## 2018-06-28 ENCOUNTER — Encounter: Payer: BLUE CROSS/BLUE SHIELD | Admitting: Occupational Therapy

## 2018-06-28 ENCOUNTER — Ambulatory Visit: Payer: BLUE CROSS/BLUE SHIELD | Admitting: Occupational Therapy

## 2018-06-28 DIAGNOSIS — I890 Lymphedema, not elsewhere classified: Secondary | ICD-10-CM

## 2018-06-28 NOTE — Therapy (Signed)
[place] MAIN [facility] SERVICES [address] [place], [place], [address] Phone: [phone]   Fax:  [phone]  Occupational Therapy Treatment  Patient Details  Name: Barbara Buckley MRN: [id number] Date of Birth: 05/19/1961 Referring Provider (OT): Floy Sabina , MD   Encounter Date: 06/28/2018  OT End of Session - 06/28/18 1730    Visit Number  5    Number of Visits  36    Date for OT Re-Evaluation  09/11/18    OT Start Time  0100    OT Stop Time  0215    OT Time Calculation (min)  75 min    Activity Tolerance  Patient tolerated treatment well;No increased pain    Behavior During Therapy  WFL for tasks assessed/performed       Past Medical History:  Diagnosis Date  . Hypertension   . Tachycardia     No past surgical history on file.  There were no vitals filed for this visit.  Subjective Assessment - 06/28/18 1719    Subjective   Barbara Buckley is here today for OT treatment visit 5/36 to address BLE lymphedema. Pt wears compression wraps to clinic. She reports wraps start falling down at the end of the day. We discussed indications and contraindications for sequential  pneumatic  compression devices, and discussed garment options and preliminary recommendations for BLE custom , flat knit knee highs for comfort and optimal swelling control at work where Pt is in dependent position all day.     Pertinent History  OSA (has CPap, but not sure re compliance); hx chronic leg swelling and pain, HTN, Obesity (BMI- 41.32 Kg/m2); Hx C section x 2; hx tachycardia, Currently exploring gastric bypass    Limitations  decreased standing, walking and extended sitting tolerance , chronic leg pain and swelling, difficulty fitting LE clothing  and footwear, leg pain and swelling limits ability to perform productive activities at work and at home, decreased body image 2/2 leg swelling    Repetition  Increases Symptoms    Patient Stated Goals   decrease leg swelling, reduce leg pain, limit LE progression    Currently in Pain?  Yes   not rated numerically today   Pain Location  Leg    Pain Orientation  Right;Left    Pain Descriptors / Indicators  Tender;Sore;Pressure;Discomfort;Heaviness;Tightness;Tiring    Pain Type  Chronic pain    Pain Onset  --   Pt estimates > 10 years                  OT Treatments/Exercises (OP) - 06/28/18 0001      ADLs   ADL Comments  See ADLs Trainng section    ADL Education Given  Yes      Manual Therapy   Manual Therapy  Edema management;Manual Lymphatic Drainage (MLD);Compression Bandaging    Manual therapy comments  skin care to LLE during MLD with low ph Castor Oil [facility]) to improve skin hydration and flexibility    Edema Management  Cont ADLs traing for LE self care throughout manual Rx    Manual Lymphatic Drainage (MLD)  MLD to LLE as established utilizing functional inguinal LN and deep lymphatic pathways in abdomen to reduce limb swelling, reduce  discomfort, improve functional ambulation and standing tolerance, and limit LE progression.    Compression Bandaging  Applied BLE, knee length, short stretch wraps as established. Replaced Rosidal foam as it was more compressed than usual. Will contact DME vendor if problem  persists.             OT Education - 06/28/18 1727    Education Details  Pt edu focused on differences between custom  lat knit and off-the-shelf circular elastic compression garments.  Educated Pt re indications for both and pros and cons of each, and rational for therapist's current recommendations  Educated Pt re estimated costs, measuring and fitting process ,DME vendor's involvement and access to insurance benefits.  Discussed indications and contra indications for LE pump. Provided on line resources. Sent Face sheet to both garment and pump dme vnedors to check benefits    Person(s) Educated  Patient    Methods  Explanation;Demonstration;Verbal  cues;Handout    Comprehension  Verbalized understanding;Returned demonstration;Need further instruction          OT Long Term Goals - 06/21/18 1001      OT LONG TERM GOAL #1   Title  Pt will demonstrate understanding of lymphedema (LE) precautions / prevention principals, including signs / symptoms of cellulitis infection with modified independence using LE Workbook as printed reference to identify 6 precautions without verbal cues by end of 3rd  OT Rx visit.     Baseline  Max A    Time  3    Period  Days    Status  Partially Met      OT LONG TERM GOAL #2   Title  Pt will be able to independently  apply knee-length, multi-layer, short stretch compression wraps using correct gradient techniques to achieve optimal limb volume reduction during Intensive Phase CDT, and to return affected limb(s) , as closely as possible, to premorbid size and shape.    Baseline  Dependent 06/21/2018 . Goal met. Pt has excellent technique and is quick study.    Time  4    Period  Days    Status  Achieved      OT LONG TERM GOAL #3   Title  Pt to achieve no less than 10% limb volume reduction in affected limb(s)  bilaterally during Intensive Phase CDT to control limb swelling, to improve tissue integrity and immune function, to improve ADLs performance and to improve functional mobility/ transfer, and to improve body image and self-esteem.    Baseline  Max A    Time  12    Period  Weeks    Status  New      OT LONG TERM GOAL #4   Time  12    Period  Weeks    Status  New      OT LONG TERM GOAL #5   Title  Pt will achieve 100% compliance with daily LE self-care home program components, including daily  skin care, simple self-MLD, gradient compression wraps/ compression garments/devices, and therapeutic exercise to ensure optimal Intensive Phase limb volume reduction to expedite compression garment/ device fitting.    Baseline  Max A    Time  12    Period  Weeks    Status  New      OT LONG TERM GOAL #6    Title  Pt will retain optimal limb volume reductions achieved during Intensive Phase CDT with no more than 3% volume increase with ongoing CG assistance to limit LE progression and further functional decline.    Baseline  Max A    Time  6    Period  Months    Status  New            Plan - 06/28/18 1732  Clinical Impression Statement  Good response to OT to CDT thus far. Pt reports B leg pain is reduced, despite remaning sore/tender to anything more than very superficial touch. Pt tolerating leg wraps well and without difficulty. Sjhe has mastered wrapping and remains 100% compliant.  Cont to explore Flexitouch. I believe this device will assist Pt with effective, long term  self care for LE management. We are exploring benefits to cover recommended custom, flat knit daytime garments and HOS devices needed to limit protien fibrosis formation during HOS. Cont as per POC.    Occupational performance deficits (Please refer to evaluation for details):  ADL's;IADL's;Rest and Sleep;Work;Leisure;Social Participation;Other   body image   Rehab Potential  Good    OT Frequency  3x / week    OT Duration  12 weeks    OT Treatment/Interventions  Self-care/ADL training;Therapeutic exercise;Manual lymph drainage;Therapeutic activities;Coping strategies training;DME and/or AE instruction;Manual Therapy;Patient/family education    Clinical Decision Making  Several treatment options, min-mod task modification necessary    Recommended Other Services  consider trial in clinic with Flexitouch 32-chamber sequential, pneumati ,compression device with Cardiology [place], Fit with flat knit, custom , knee length cmopression garments- consider Elvarex Soft ccl 3 ( 35-45 mmHg)Consider Wlvarex RELAX for HOS     Consulted and Agree with Plan of Care  Patient       Patient will benefit from skilled therapeutic intervention in order to improve the following deficits and impairments:  Decreased activity tolerance,  Decreased knowledge of use of DME, Decreased skin integrity, Increased edema, Pain, Obesity, Decreased coping skills, Impaired perceived functional ability  Visit Diagnosis: Lymphedema, not elsewhere classified    Problem List There are no active problems to display for this patient.   Andrey Spearman, MS, OTR/L, [facility] 06/28/18 5:37 PM  [place] MAIN [facility] SERVICES [address] [place], [place], [address] Phone: [phone]   Fax:  [phone]  Name: Barbara Buckley MRN: [id number] Date of Birth: July 24, 1960

## 2018-06-29 ENCOUNTER — Encounter: Payer: BLUE CROSS/BLUE SHIELD | Admitting: Occupational Therapy

## 2018-06-30 ENCOUNTER — Encounter: Payer: BLUE CROSS/BLUE SHIELD | Admitting: Occupational Therapy

## 2018-07-04 ENCOUNTER — Ambulatory Visit: Payer: BLUE CROSS/BLUE SHIELD | Admitting: Occupational Therapy

## 2018-07-04 DIAGNOSIS — I890 Lymphedema, not elsewhere classified: Secondary | ICD-10-CM

## 2018-07-05 ENCOUNTER — Encounter: Payer: BLUE CROSS/BLUE SHIELD | Admitting: Occupational Therapy

## 2018-07-05 ENCOUNTER — Ambulatory Visit: Payer: BLUE CROSS/BLUE SHIELD | Admitting: Dietician

## 2018-07-05 ENCOUNTER — Other Ambulatory Visit: Payer: Self-pay | Admitting: Surgery

## 2018-07-05 ENCOUNTER — Ambulatory Visit
Admission: RE | Admit: 2018-07-05 | Discharge: 2018-07-05 | Disposition: A | Discharge status: Home / Self Care | Payer: BLUE CROSS/BLUE SHIELD | Source: Ambulatory Visit | Attending: Surgery | Admitting: Surgery

## 2018-07-05 NOTE — Therapy (Signed)
[place] MAIN [facility] SERVICES [address] [place], [place], [address] Phone: [phone]   Fax:  [phone]  Occupational Therapy Treatment  Patient Details  Name: Barbara Buckley MRN: [id number] Date of Birth: December 05, 1960 Referring Provider (OT): Floy Sabina , MD   Encounter Date: 07/04/2018  OT End of Session - 07/04/18 1341    Visit Number  6    Number of Visits  36    Date for OT Re-Evaluation  09/11/18    OT Start Time  1120    OT Stop Time  1232    OT Time Calculation (min)  72 min    Activity Tolerance  Patient tolerated treatment well;No increased pain    Behavior During Therapy  WFL for tasks assessed/performed       Past Medical History:  Diagnosis Date  . Hypertension   . Tachycardia     No past surgical history on file.  There were no vitals filed for this visit.  Subjective Assessment - 07/04/18 1334    Subjective   Pt presents for OT treatment visit 6/36 to address BLE lymphedema. Pt wears compression wraps to clinic. Pt states, "My legs feel so much better with the wraps on. I dont like to take them off.     Pertinent History  OSA (has CPap, but not sure re compliance); hx chronic leg swelling and pain, HTN, Obesity (BMI- 41.32 Kg/m2); Hx C section x 2; hx tachycardia, Currently exploring gastric bypass    Limitations  decreased standing, walking and extended sitting tolerance , chronic leg pain and swelling, difficulty fitting LE clothing  and footwear, leg pain and swelling limits ability to perform productive activities at work and at home, decreased body image 2/2 leg swelling    Repetition  Increases Symptoms    Patient Stated Goals  decrease leg swelling, reduce leg pain, limit LE progression    Currently in Pain?  No/denies    Pain Orientation  Left;Right    Pain Type  Chronic pain    Pain Onset  --   Pt estimates > 10 years                          OT Education -  07/04/18 1337    Education Details  Pt edu for LE self-care components throughout session, including simple self MLD, skin care, ther ex and comnpression therapies, to limit LE progression    Person(s) Educated  Patient    Methods  Explanation;Demonstration;Verbal cues;Handout    Comprehension  Verbalized understanding;Returned demonstration;Need further instruction          OT Long Term Goals - 06/21/18 1001      OT LONG TERM GOAL #1   Title  Pt will demonstrate understanding of lymphedema (LE) precautions / prevention principals, including signs / symptoms of cellulitis infection with modified independence using LE Workbook as printed reference to identify 6 precautions without verbal cues by end of 3rd  OT Rx visit.     Baseline  Max A    Time  3    Period  Days    Status  Partially Met      OT LONG TERM GOAL #2   Title  Pt will be able to independently  apply knee-length, multi-layer, short stretch compression wraps using correct gradient techniques to achieve optimal limb volume reduction during Intensive Phase CDT, and to return affected limb(s) , as closely as possible, to premorbid  size and shape.    Baseline  Dependent 06/21/2018 . Goal met. Pt has excellent technique and is quick study.    Time  4    Period  Days    Status  Achieved      OT LONG TERM GOAL #3   Title  Pt to achieve no less than 10% limb volume reduction in affected limb(s)  bilaterally during Intensive Phase CDT to control limb swelling, to improve tissue integrity and immune function, to improve ADLs performance and to improve functional mobility/ transfer, and to improve body image and self-esteem.    Baseline  Max A    Time  12    Period  Weeks    Status  New      OT LONG TERM GOAL #4   Time  12    Period  Weeks    Status  New      OT LONG TERM GOAL #5   Title  Pt will achieve 100% compliance with daily LE self-care home program components, including daily  skin care, simple self-MLD, gradient  compression wraps/ compression garments/devices, and therapeutic exercise to ensure optimal Intensive Phase limb volume reduction to expedite compression garment/ device fitting.    Baseline  Max A    Time  12    Period  Weeks    Status  New      OT LONG TERM GOAL #6   Title  Pt will retain optimal limb volume reductions achieved during Intensive Phase CDT with no more than 3% volume increase with ongoing CG assistance to limit LE progression and further functional decline.    Baseline  Max A    Time  6    Period  Months    Status  New              Patient will benefit from skilled therapeutic intervention in order to improve the following deficits and impairments:     Visit Diagnosis: Lymphedema, not elsewhere classified    Problem List There are no active problems to display for this patient.   Andrey Spearman, MS, OTR/L, [facility] 07/05/18 1:44 PM  [place] MAIN [facility] SERVICES [address] [place], [place], [address] Phone: [phone]   Fax:  [phone]  Name: Barbara Buckley MRN: [id number] Date of Birth: [birth year]

## 2018-07-06 ENCOUNTER — Encounter: Payer: Self-pay | Admitting: Dietician

## 2018-07-06 ENCOUNTER — Encounter: Payer: BLUE CROSS/BLUE SHIELD | Attending: Surgery | Admitting: Dietician

## 2018-07-06 ENCOUNTER — Ambulatory Visit: Payer: BLUE CROSS/BLUE SHIELD | Admitting: Occupational Therapy

## 2018-07-06 VITALS — Ht 66.0 in | Wt 258.7 lb

## 2018-07-06 DIAGNOSIS — Z6841 Body Mass Index (BMI) 40.0 and over, adult: Secondary | ICD-10-CM

## 2018-07-06 DIAGNOSIS — I890 Lymphedema, not elsewhere classified: Secondary | ICD-10-CM | POA: Diagnosis not present

## 2018-07-06 NOTE — Patient Instructions (Signed)
   Practice sipping rather than chugging water/ fluids.   Limit processed foods, especially high fat and high sugar foods.   Continue with other healthy food choices.   Practice eating slowly, allow 15-30 minutes to eat a meal.

## 2018-07-06 NOTE — Progress Notes (Signed)
Nutrition Assessment  Proposed Surgery: Sleeve gastrectomy  MD: Luretha Murphy RD: Connye Burkitt  Height: 5'6" Weight: 258.7lbs BMI: 41.76 Upper IBW% (UIBW): 181% (IBW 143lbs)  Patient's Goal Weight: no particular goal; wants improved health  Medical History: HTN, tachycardia, lymphedema, sleep apnea Medications and Supplements: Calcium Carbonate-Vitamin D, losartan, Methylsulfonylmethane, metoprolol succinate, multivitamin, Turmeric, valACYclovir  Previous surgeries: C-section x2 , 2 foot surgeries, bladder surgery, tonsillectomy at age 69 Drug allergies: lisinopril Food allergies: none known Alcohol use: occasional, 0-2 drinks per month  Tobacco use: never  Physical activity: none at this time  Weight history: Childhood: normal, thin    Adolescence: athletic, thin    Adulthood: young adulthood weight fluctuated 10-20lbs, multiple diets and weight loss followed by weight regain.     Weight 1 year ago: 250s  Dieting/ weight loss history: Patient reports trying multiple and varied diets over the years, including liquid diet, with limited and only short-term success. She has worked on exercise often and has lost weight with regular exercise but unable to maintain the weight loss.   Dietary Recall:  Daily pattern: 2-3 meals and 1-2 snacks. Dining out: 5-7 meals per week.  Breakfast: 1/29 Greek yogurt with blueberries, homemade granola; occasional chicken biscuit Lunch: wide variety per patient, sommetimes as late as 3pm and will burrito, snacks ie donut Supper: 8-9pm usually cooks, meat = veg, pasta, variety Snack(s): varies; had apple and peanut butter recently when unable to sleep, occasional sweets ie cookies Beverages: water. diet coke  Psychosocial: Emotional eating history: some eating when bored, not much stress eating Disordered eating history: none   Intervention:  Patient has researched this procedure by consulting with family members and friends  Patient reports good  baseline nutrition knowledge.   Instructed her on pre-op diet guidelines, including liver reduction diet.   Discussed stages of the bariatric diet after surgery as well as the importance of adequate protein and fluid intake.   Summary:  Patient has made diet and lifestyle changes in effort to lose weight and prepare for bariatric surgery.  She has solid support from family and friends.   She agrees to work on slower eating and drinking, controlling sugar and fat intake prior to surgery.   She is motivated to follow the bariatric diet after surgery. From a nutrition standpoint, she is ready to proceed with the bariatric surgery program.    Plan:  Patient commits to returning for pre-op class visit prior to surgery.   She will plan to return for post-op RD visits beginning 2 weeks after surgery.

## 2018-07-06 NOTE — Therapy (Signed)
[place] MAIN [facility] SERVICES [address] [place], [place], [address] Phone: [phone]   Fax:  [phone]  Occupational Therapy Treatment  Patient Details  Name: Barbara Buckley MRN: [id number] Date of Birth: April 08, 1961 Referring Provider (OT): Floy Sabina , MD   Encounter Date: 07/06/2018  OT End of Session - 07/06/18 1152    Visit Number  7    Number of Visits  36    Date for OT Re-Evaluation  09/11/18    OT Start Time  0900    OT Stop Time  1000    OT Time Calculation (min)  60 min    Activity Tolerance  Patient tolerated treatment well;No increased pain    Behavior During Therapy  WFL for tasks assessed/performed       Past Medical History:  Diagnosis Date  . Hypertension   . Tachycardia     No past surgical history on file.  There were no vitals filed for this visit.  Subjective Assessment - 07/06/18 1147    Subjective   Pt presents for OT treatment visit 7/36 to address BLE lymphedema. Pt wears compression wraps to clinic. Pt in agreement with plan to complete garment masurements today for LLE.    Pertinent History  OSA (has CPap, but not sure re compliance); hx chronic leg swelling and pain, HTN, Obesity (BMI- 41.32 Kg/m2); Hx C section x 2; hx tachycardia, Currently exploring gastric bypass    Limitations  decreased standing, walking and extended sitting tolerance , chronic leg pain and swelling, difficulty fitting LE clothing  and footwear, leg pain and swelling limits ability to perform productive activities at work and at home, decreased body image 2/2 leg swelling    Repetition  Increases Symptoms    Patient Stated Goals  decrease leg swelling, reduce leg pain, limit LE progression    Currently in Pain?  Yes    Pain Score  --   not rated numerically   Pain Location  Leg    Pain Orientation  Right;Left    Pain Descriptors / Indicators   Aching;Tender;Throbbing;Sore;Pressure;Discomfort;Heaviness;Tightness;Tiring    Pain Type  Chronic pain    Pain Radiating Towards  compression, elevation    Pain Onset  --   Pt estimates > 10 years   Pain Frequency  Intermittent    Aggravating Factors   worsens when compression removed. Worsens with dependent positioning                   OT Treatments/Exercises (OP) - 07/06/18 0001      ADLs   ADL Education Given  Yes      Manual Therapy   Manual Therapy  Edema management;Manual Lymphatic Drainage (MLD);Compression Bandaging    Edema Management  Completed LLE only anatomical measurements for custom knee length cmopression garment and HOS device    Manual Lymphatic Drainage (MLD)  MLD to LLE as established utilizing functional inguinal LN and deep lymphatic pathways in abdomen to reduce limb swelling, reduce  discomfort, improve functional ambulation and standing tolerance, and limit LE progression.    Compression Bandaging  Applied BLE, knee length, short stretch wraps as established. Replaced Rosidal foam as it was more compressed than usual. Will contact DME vendor if problem persists.             OT Education - 07/06/18 1151    Education Details  Pt edu throughout session for custom garment measuring and fitting process. Discussed garment/ device wear and care  regimes briefly    Person(s) Educated  Patient    Methods  Explanation;Demonstration;Verbal cues;Handout    Comprehension  Verbalized understanding;Returned demonstration;Need further instruction          OT Long Term Goals - 06/21/18 1001      OT LONG TERM GOAL #1   Title  Pt will demonstrate understanding of lymphedema (LE) precautions / prevention principals, including signs / symptoms of cellulitis infection with modified independence using LE Workbook as printed reference to identify 6 precautions without verbal cues by end of 3rd  OT Rx visit.     Baseline  Max A    Time  3    Period  Days     Status  Partially Met      OT LONG TERM GOAL #2   Title  Pt will be able to independently  apply knee-length, multi-layer, short stretch compression wraps using correct gradient techniques to achieve optimal limb volume reduction during Intensive Phase CDT, and to return affected limb(s) , as closely as possible, to premorbid size and shape.    Baseline  Dependent 06/21/2018 . Goal met. Pt has excellent technique and is quick study.    Time  4    Period  Days    Status  Achieved      OT LONG TERM GOAL #3   Title  Pt to achieve no less than 10% limb volume reduction in affected limb(s)  bilaterally during Intensive Phase CDT to control limb swelling, to improve tissue integrity and immune function, to improve ADLs performance and to improve functional mobility/ transfer, and to improve body image and self-esteem.    Baseline  Max A    Time  12    Period  Weeks    Status  New      OT LONG TERM GOAL #4   Time  12    Period  Weeks    Status  New      OT LONG TERM GOAL #5   Title  Pt will achieve 100% compliance with daily LE self-care home program components, including daily  skin care, simple self-MLD, gradient compression wraps/ compression garments/devices, and therapeutic exercise to ensure optimal Intensive Phase limb volume reduction to expedite compression garment/ device fitting.    Baseline  Max A    Time  12    Period  Weeks    Status  New      OT LONG TERM GOAL #6   Title  Pt will retain optimal limb volume reductions achieved during Intensive Phase CDT with no more than 3% volume increase with ongoing CG assistance to limit LE progression and further functional decline.    Baseline  Max A    Time  6    Period  Months    Status  New            Plan - 07/06/18 1153    Clinical Impression Statement  Pt tolerated anatomical measurements for LLE custom compression knee high and HOS device withiout difficulty. Ordered Jobst, custom, knee length, ELVAREV stockings x 2 ( one  to wash and one to wear) - ccl 3 ( 34-46 mmHg). Ordered Jobst RELAX convoluted , knee length compression boot to limit protien deposition during HOS, resulting in LE progression. Sumbitted to vendor by fax. Provided brief LLE MLD and skin care, and re-applied LLE compression wraps with reported pain reduction at end of session. Cont as per POC. Commence RLE s/p LLE garments/ devices are fitted.  Occupational performance deficits (Please refer to evaluation for details):  ADL's;IADL's;Rest and Sleep;Work;Leisure;Social Participation;Other   body image   Rehab Potential  Good    OT Frequency  3x / week    OT Duration  12 weeks    OT Treatment/Interventions  Self-care/ADL training;Therapeutic exercise;Manual lymph drainage;Therapeutic activities;Coping strategies training;DME and/or AE instruction;Manual Therapy;Patient/family education    Clinical Decision Making  Several treatment options, min-mod task modification necessary    Recommended Other Services  consider trial in clinic with Flexitouch 32-chamber sequential, pneumati ,compression device with Cardiology [place], Fit with flat knit, custom , knee length cmopression garments- consider Elvarex Soft ccl 3 ( 35-45 mmHg)Consider Wlvarex RELAX for HOS     Consulted and Agree with Plan of Care  Patient       Patient will benefit from skilled therapeutic intervention in order to improve the following deficits and impairments:  Decreased activity tolerance, Decreased knowledge of use of DME, Decreased skin integrity, Increased edema, Pain, Obesity, Decreased coping skills, Impaired perceived functional ability  Visit Diagnosis: Lymphedema, not elsewhere classified    Problem List There are no active problems to display for this patient.  Andrey Spearman, MS, OTR/L, [facility] 07/06/18 11:58 AM  [place] MAIN [facility] SERVICES [address] [place], [place], [address] Phone: [phone]   Fax:   [phone]  Name: Barbara Buckley MRN: [id number] Date of Birth: 21-Aug-1960

## 2018-07-07 ENCOUNTER — Encounter: Payer: BLUE CROSS/BLUE SHIELD | Admitting: Occupational Therapy

## 2018-07-12 ENCOUNTER — Encounter: Payer: BLUE CROSS/BLUE SHIELD | Admitting: Occupational Therapy

## 2018-07-14 ENCOUNTER — Encounter: Payer: BLUE CROSS/BLUE SHIELD | Admitting: Occupational Therapy

## 2018-07-14 ENCOUNTER — Ambulatory Visit: Payer: BLUE CROSS/BLUE SHIELD | Attending: Internal Medicine | Admitting: Occupational Therapy

## 2018-07-14 DIAGNOSIS — I890 Lymphedema, not elsewhere classified: Secondary | ICD-10-CM

## 2018-07-14 NOTE — Therapy (Signed)
[place] MAIN [facility] SERVICES [address] [place], [place], [address] Phone: [phone]   Fax:  [phone]  Occupational Therapy Treatment  Patient Details  Name: Barbara Buckley MRN: [id number] Date of Birth: 14-Apr-1961 Referring Provider (OT): Floy Sabina , MD   Encounter Date: 07/14/2018  OT End of Session - 07/14/18 1413    Visit Number  8    Number of Visits  36    Date for OT Re-Evaluation  09/11/18    OT Start Time  0105    OT Stop Time  0215    OT Time Calculation (min)  70 min    Activity Tolerance  Patient tolerated treatment well;No increased pain    Behavior During Therapy  WFL for tasks assessed/performed       Past Medical History:  Diagnosis Date  . Hypertension   . Tachycardia     No past surgical history on file.  There were no vitals filed for this visit.  Subjective Assessment - 07/14/18 1639    Subjective   Pt presents for OT treatment visit 8/36 to address BLE lymphedema. Pt wears compression wraps to clinic. Discussed plan for compression garments going forward throughout session.    Pertinent History  OSA (has CPap, but not sure re compliance); hx chronic leg swelling and pain, HTN, Obesity (BMI- 41.32 Kg/m2); Hx C section x 2; hx tachycardia, Currently exploring gastric bypass    Limitations  decreased standing, walking and extended sitting tolerance , chronic leg pain and swelling, difficulty fitting LE clothing  and footwear, leg pain and swelling limits ability to perform productive activities at work and at home, decreased body image 2/2 leg swelling    Repetition  Increases Symptoms    Patient Stated Goals  decrease leg swelling, reduce leg pain, limit LE progression    Currently in Pain?  Yes    Pain Score  --   not rated numerically   Pain Location  Leg    Pain Orientation  Right;Left    Pain Descriptors / Indicators   Tender;Sore;Pressure;Discomfort;Heaviness;Tightness;Restless;Tiring    Pain Type  Chronic pain    Pain Onset  --   Pt estimates > 10 years                  OT Treatments/Exercises (OP) - 07/14/18 0001      ADLs   ADL Education Given  Yes      Manual Therapy   Manual Therapy  Edema management;Manual Lymphatic Drainage (MLD);Compression Bandaging    Manual therapy comments  Completed anatomical measurements for [employer] Juzo knee high , ccl 2 ( 30-40) compression garments    Manual Lymphatic Drainage (MLD)  MLD to LLE as established utilizing functional inguinal LN and deep lymphatic pathways in abdomen to reduce limb swelling, reduce  discomfort, improve functional ambulation and standing tolerance, and limit LE progression.    Compression Bandaging  Applied BLE, knee length, short stretch wraps as established. Replaced Rosidal foam as it was more compressed than usual. Will contact DME vendor if problem persists.             OT Education - 07/14/18 1642    Education Details  Pt edu re compression garment features and options for both custom and OTS versions- pros and cons, cost issues, weight loss issues w/ bariatric sx pending    Person(s) Educated  Patient    Methods  Explanation;Demonstration;Verbal cues;Handout    Comprehension  Verbalized understanding;Returned  demonstration;Need further instruction          OT Long Term Goals - 06/21/18 1001      OT LONG TERM GOAL #1   Title  Pt will demonstrate understanding of lymphedema (LE) precautions / prevention principals, including signs / symptoms of cellulitis infection with modified independence using LE Workbook as printed reference to identify 6 precautions without verbal cues by end of 3rd  OT Rx visit.     Baseline  Max A    Time  3    Period  Days    Status  Partially Met      OT LONG TERM GOAL #2   Title  Pt will be able to independently  apply knee-length, multi-layer, short stretch compression  wraps using correct gradient techniques to achieve optimal limb volume reduction during Intensive Phase CDT, and to return affected limb(s) , as closely as possible, to premorbid size and shape.    Baseline  Dependent 06/21/2018 . Goal met. Pt has excellent technique and is quick study.    Time  4    Period  Days    Status  Achieved      OT LONG TERM GOAL #3   Title  Pt to achieve no less than 10% limb volume reduction in affected limb(s)  bilaterally during Intensive Phase CDT to control limb swelling, to improve tissue integrity and immune function, to improve ADLs performance and to improve functional mobility/ transfer, and to improve body image and self-esteem.    Baseline  Max A    Time  12    Period  Weeks    Status  New      OT LONG TERM GOAL #4   Time  12    Period  Weeks    Status  New      OT LONG TERM GOAL #5   Title  Pt will achieve 100% compliance with daily LE self-care home program components, including daily  skin care, simple self-MLD, gradient compression wraps/ compression garments/devices, and therapeutic exercise to ensure optimal Intensive Phase limb volume reduction to expedite compression garment/ device fitting.    Baseline  Max A    Time  12    Period  Weeks    Status  New      OT LONG TERM GOAL #6   Title  Pt will retain optimal limb volume reductions achieved during Intensive Phase CDT with no more than 3% volume increase with ongoing CG assistance to limit LE progression and further functional decline.    Baseline  Max A    Time  6    Period  Months    Status  New            Plan - 07/14/18 1644    Clinical Impression Statement  Pt tolerated MLD to LLE and compression wraps bilaterally after manual therapy without difficulty. Legs inspected   and swelling is very well controlled. Skn is very well hydrated. Pain persists but is significantly decreased when Pt using wraps. Pt reports tooday that bariatric surgery is pending possibly as soon as March  2020. Pt concerned about obtaining expensive custom compression garments and pump with weight loss pending and until her very large deductible is met. We agreed tyo try ccl 2 OTS garments as an option to reduce time she's using wraps. Measurements indicate Pt will fit into size 5 Juzo Dynamic. Pt will do best w/ ccl 2. Provided printed resource for on line purchase. Pt also  agrees w/ plan to decrease OT frequency to 1 x weekly until garment fitting is complete.    Occupational performance deficits (Please refer to evaluation for details):  ADL's;IADL's;Rest and Sleep;Work;Leisure;Social Participation;Other   body image   Rehab Potential  Good    OT Frequency  3x / week    OT Duration  12 weeks    OT Treatment/Interventions  Self-care/ADL training;Therapeutic exercise;Manual lymph drainage;Therapeutic activities;Coping strategies training;DME and/or AE instruction;Manual Therapy;Patient/family education    Clinical Decision Making  Several treatment options, min-mod task modification necessary    Recommended Other Services  consider trial in clinic with Flexitouch 32-chamber sequential, pneumati ,compression device with Cardiology [place], Fit with flat knit, custom , knee length cmopression garments- consider Elvarex Soft ccl 3 ( 35-45 mmHg)Consider Wlvarex RELAX for HOS     Consulted and Agree with Plan of Care  Patient       Patient will benefit from skilled therapeutic intervention in order to improve the following deficits and impairments:  Decreased activity tolerance, Decreased knowledge of use of DME, Decreased skin integrity, Increased edema, Pain, Obesity, Decreased coping skills, Impaired perceived functional ability  Visit Diagnosis: Lymphedema, not elsewhere classified    Problem List There are no active problems to display for this patient.   Andrey Spearman, MS, OTR/L, [facility] 07/14/18 4:49 PM  [place] MAIN [facility] SERVICES [address]  [place], [place], [address] Phone: [phone]   Fax:  [phone]  Name: Caliann Leckrone MRN: [id number] Date of Birth: 26-Oct-1960

## 2018-07-19 ENCOUNTER — Ambulatory Visit: Payer: BLUE CROSS/BLUE SHIELD | Admitting: Occupational Therapy

## 2018-07-19 ENCOUNTER — Encounter: Payer: BLUE CROSS/BLUE SHIELD | Admitting: Occupational Therapy

## 2018-07-19 DIAGNOSIS — I890 Lymphedema, not elsewhere classified: Secondary | ICD-10-CM | POA: Diagnosis not present

## 2018-07-19 NOTE — Therapy (Signed)
[place] MAIN [facility] SERVICES [address] [place], [place], [address] Phone: [phone]   Fax:  [phone]  Occupational Therapy Treatment  Patient Details  Name: Chia Mowers MRN: [id number] Date of Birth: June 04, 1961 Referring Provider (OT): Floy Sabina , MD   Encounter Date: 07/19/2018  OT End of Session - 07/19/18 1620    Visit Number  9    Number of Visits  36    Date for OT Re-Evaluation  09/11/18    OT Start Time  0100    OT Stop Time  0200    OT Time Calculation (min)  60 min    Activity Tolerance  Patient tolerated treatment well;No increased pain    Behavior During Therapy  WFL for tasks assessed/performed       Past Medical History:  Diagnosis Date  . Hypertension   . Tachycardia     No past surgical history on file.  There were no vitals filed for this visit.  Subjective Assessment - 07/19/18 1616    Subjective   Pt presents for OT treatment visit 9/36 to address BLE lymphedema. Pt wears compression wraps to clinic. Pt has no new complaints today.    Pertinent History  OSA (has CPap, but not sure re compliance); hx chronic leg swelling and pain, HTN, Obesity (BMI- 41.32 Kg/m2); Hx C section x 2; hx tachycardia, Currently exploring gastric bypass    Limitations  decreased standing, walking and extended sitting tolerance , chronic leg pain and swelling, difficulty fitting LE clothing  and footwear, leg pain and swelling limits ability to perform productive activities at work and at home, decreased body image 2/2 leg swelling    Repetition  Increases Symptoms    Patient Stated Goals  decrease leg swelling, reduce leg pain, limit LE progression    Currently in Pain?  Yes    Pain Score  --   not rated numerically. pain reduced substantially since commencing CDT. Pain increases whn removing compression wraps   Pain Location  Leg    Pain Orientation  Right;Left;Lower    Pain Descriptors / Indicators   Tender;Throbbing;Aching;Pressure;Tightness;Tiring;Heaviness;Discomfort    Pain Type  Chronic pain    Pain Onset  --   Pt estimates > 10 years                  OT Treatments/Exercises (OP) - 07/19/18 0001      ADLs   ADL Education Given  Yes      Manual Therapy   Manual Therapy  Edema management;Manual Lymphatic Drainage (MLD);Compression Bandaging    Manual Lymphatic Drainage (MLD)  MLD to BLE as established utilizing functional inguinal LN and deep lymphatic pathways in abdomen to reduce limb swelling, reduce  discomfort, improve functional ambulation and standing tolerance, and limit LE progression.    Compression Bandaging  Applied BLE, knee length, short stretch wraps as established. Replaced Rosidal foam as it was more compressed than usual. Will contact DME vendor if problem persists.             OT Education - 07/19/18 1620    Education Details  Pt edu for LE self-care components throughout session, including simple self MLD, skin care, ther ex and comnpression therapies, to limit LE progression    Person(s) Educated  Patient    Methods  Explanation;Demonstration;Verbal cues;Handout    Comprehension  Verbalized understanding;Returned demonstration;Need further instruction          OT Long Term Goals - 06/21/18  [place] #1   Title  Pt will demonstrate understanding of lymphedema (LE) precautions / prevention principals, including signs / symptoms of cellulitis infection with modified independence using LE Workbook as printed reference to identify 6 precautions without verbal cues by end of 3rd  OT Rx visit.     Baseline  Max A    Time  3    Period  Days    Status  Partially Met      OT LONG TERM GOAL #2   Title  Pt will be able to independently  apply knee-length, multi-layer, short stretch compression wraps using correct gradient techniques to achieve optimal limb volume reduction during Intensive Phase CDT, and to return affected  limb(s) , as closely as possible, to premorbid size and shape.    Baseline  Dependent 06/21/2018 . Goal met. Pt has excellent technique and is quick study.    Time  4    Period  Days    Status  Achieved      OT LONG TERM GOAL #3   Title  Pt to achieve no less than 10% limb volume reduction in affected limb(s)  bilaterally during Intensive Phase CDT to control limb swelling, to improve tissue integrity and immune function, to improve ADLs performance and to improve functional mobility/ transfer, and to improve body image and self-esteem.    Baseline  Max A    Time  12    Period  Weeks    Status  New      OT LONG TERM GOAL #4   Time  12    Period  Weeks    Status  New      OT LONG TERM GOAL #5   Title  Pt will achieve 100% compliance with daily LE self-care home program components, including daily  skin care, simple self-MLD, gradient compression wraps/ compression garments/devices, and therapeutic exercise to ensure optimal Intensive Phase limb volume reduction to expedite compression garment/ device fitting.    Baseline  Max A    Time  12    Period  Weeks    Status  New      OT LONG TERM GOAL #6   Title  Pt will retain optimal limb volume reductions achieved during Intensive Phase CDT with no more than 3% volume increase with ongoing CG assistance to limit LE progression and further functional decline.    Baseline  Max A    Time  6    Period  Months    Status  New            Plan - 07/19/18 1621    Clinical Impression Statement  BLE legs below knees are substantially more swollen today than they have been typically. Pt  has no idea why swelling is increased today. Pt tolerated BLE MLD  skin care and compression wrapping to knee today without difficulty.   fit with ccl 2 (30-40 mmHg) size 5 Juzo Dynamic. knee high ASAP then decrease OT frequency to 1 x weekly fo complete transition to Management Phase CDT.  Provided printed resource for on line purchase.    Occupational  performance deficits (Please refer to evaluation for details):  ADL's;IADL's;Rest and Sleep;Work;Leisure;Social Participation;Other   body image   Rehab Potential  Good    OT Frequency  3x / week    OT Duration  12 weeks    OT Treatment/Interventions  Self-care/ADL training;Therapeutic exercise;Manual lymph drainage;Therapeutic activities;Coping strategies training;DME and/or  AE instruction;Manual Therapy;Patient/family education    Plan   fit with ccl 2 (30-40 mmHg) size 5 Juzo Dynamic. knee high ASAP then decrease OT frequency to 1 x weekly fo complete transition to Management Phase CDT.  Provided printed resource for on line purchase.     Clinical Decision Making  Several treatment options, min-mod task modification necessary    Recommended Other Services  consider trial in clinic with Flexitouch 32-chamber sequential, pneumati ,compression device with Cardiology [place], Fit with flat knit, custom , knee length cmopression garments- consider Elvarex Soft ccl 3 ( 35-45 mmHg)Consider Wlvarex RELAX for HOS     Consulted and Agree with Plan of Care  Patient       Patient will benefit from skilled therapeutic intervention in order to improve the following deficits and impairments:  Decreased activity tolerance, Decreased knowledge of use of DME, Decreased skin integrity, Increased edema, Pain, Obesity, Decreased coping skills, Impaired perceived functional ability  Visit Diagnosis: Lymphedema, not elsewhere classified    Problem List There are no active problems to display for this patient.  Andrey Spearman, MS, OTR/L, [facility] 07/19/18 4:28 PM   [place] MAIN [facility] SERVICES [address] [place], [place], [address] Phone: [phone]   Fax:  [phone]  Name: Ilee Randleman MRN: [id number] Date of Birth: 04-24-1961

## 2018-07-28 ENCOUNTER — Telehealth: Payer: Self-pay | Admitting: Dietician

## 2018-07-28 ENCOUNTER — Encounter: Payer: BLUE CROSS/BLUE SHIELD | Admitting: Occupational Therapy

## 2018-07-28 NOTE — Telephone Encounter (Signed)
Returned message from patient to cancel attendance at bariatric pre-op class tomorrow 07/29/18. Call was not able to be completed; will attempt again later.

## 2018-07-28 NOTE — Telephone Encounter (Signed)
Returned voicemail message from patient to cancel attendance at pre-op class tomorrow 07/29/18, as her surgery likely will not be for another 2 months; she was unable to get a behavioral health visit until 09/2018. She will call to reschedule later, as she is on a cancellation list at the psychiatrist's office.

## 2018-07-29 ENCOUNTER — Ambulatory Visit: Payer: BLUE CROSS/BLUE SHIELD

## 2018-08-10 ENCOUNTER — Emergency Department: Payer: BLUE CROSS/BLUE SHIELD

## 2018-08-10 ENCOUNTER — Other Ambulatory Visit: Payer: Self-pay

## 2018-08-10 ENCOUNTER — Encounter: Payer: Self-pay | Admitting: Emergency Medicine

## 2018-08-10 ENCOUNTER — Emergency Department
Admission: EM | Admit: 2018-08-10 | Discharge: 2018-08-10 | Disposition: A | Discharge status: Home / Self Care | Payer: BLUE CROSS/BLUE SHIELD | Attending: Emergency Medicine | Admitting: Emergency Medicine

## 2018-08-10 DIAGNOSIS — R531 Weakness: Secondary | ICD-10-CM | POA: Diagnosis not present

## 2018-08-10 DIAGNOSIS — Z79899 Other long term (current) drug therapy: Secondary | ICD-10-CM | POA: Insufficient documentation

## 2018-08-10 DIAGNOSIS — R110 Nausea: Secondary | ICD-10-CM | POA: Insufficient documentation

## 2018-08-10 DIAGNOSIS — I10 Essential (primary) hypertension: Secondary | ICD-10-CM | POA: Insufficient documentation

## 2018-08-10 DIAGNOSIS — R002 Palpitations: Secondary | ICD-10-CM | POA: Insufficient documentation

## 2018-08-10 DIAGNOSIS — R0789 Other chest pain: Secondary | ICD-10-CM | POA: Diagnosis present

## 2018-08-10 DIAGNOSIS — F419 Anxiety disorder, unspecified: Secondary | ICD-10-CM | POA: Insufficient documentation

## 2018-08-10 DIAGNOSIS — K219 Gastro-esophageal reflux disease without esophagitis: Secondary | ICD-10-CM | POA: Diagnosis not present

## 2018-08-10 LAB — BASIC METABOLIC PANEL
Anion gap: 11 (ref 5–15)
BUN: 26 mg/dL — AB (ref 6–20)
CALCIUM: 9.5 mg/dL (ref 8.9–10.3)
CO2: 20 mmol/L — AB (ref 22–32)
Chloride: 104 mmol/L (ref 98–111)
Creatinine, Ser: 0.74 mg/dL (ref 0.44–1.00)
GFR calc Af Amer: >60 mL/min (ref >60)
GFR calc non Af Amer: >60 mL/min (ref >60)
GLUCOSE: 118 mg/dL — AB (ref 70–99)
Potassium: 3.6 mmol/L (ref 3.5–5.1)
Sodium: 135 mmol/L (ref 135–145)

## 2018-08-10 LAB — CBC
HCT: 40.9 % (ref 36.0–46.0)
Hemoglobin: 13.6 g/dL (ref 12.0–15.0)
MCH: 28.6 pg (ref 26.0–34.0)
MCHC: 33.3 g/dL (ref 30.0–36.0)
MCV: 86.1 fL (ref 80.0–100.0)
PLATELETS: 259 10*3/uL (ref 150–400)
RBC: 4.75 MIL/uL (ref 3.87–5.11)
RDW: 13.5 % (ref 11.5–15.5)
WBC: 11.7 10*3/uL — ABNORMAL HIGH (ref 4.0–10.5)
nRBC: 0 % (ref 0.0–0.2)

## 2018-08-10 LAB — HEPATIC FUNCTION PANEL
ALT: 25 U/L (ref 0–44)
AST: 23 U/L (ref 15–41)
Albumin: 4.5 g/dL (ref 3.5–5.0)
Alkaline Phosphatase: 65 U/L (ref 38–126)
Bilirubin, Direct: <0.1 mg/dL (ref 0.0–0.2)
Total Bilirubin: 0.7 mg/dL (ref 0.3–1.2)
Total Protein: 7.7 g/dL (ref 6.5–8.1)

## 2018-08-10 LAB — LIPASE, BLOOD: Lipase: 27 U/L (ref 11–51)

## 2018-08-10 LAB — TROPONIN I
Troponin I: <0.03 ng/mL (ref <0.03)
Troponin I: <0.03 ng/mL (ref <0.03)

## 2018-08-10 NOTE — ED Notes (Signed)
ED Provider at bedside. 

## 2018-08-10 NOTE — ED Triage Notes (Signed)
Pt woke up feeling weak.  Pt also c/o headache, burning in chest, and jaw pain.  Pt keeps stating "I just don't feel right".  At first mentioned that left arm was weak but now just saying she doesn't feel right and not able to differentiate if unilateral or general weakness.  +nausea.  No SHOB.  Equal grip strength.  No arm drift. No facial droop. Clear speech.

## 2018-08-10 NOTE — ED Notes (Addendum)
Refer to triage note: per states "just don't right and it doesn't feel like chest pain, it is more like heartburn", jaw pain, weakness on left arm "but not as bad as it was earlier". Pt has a hx of migraines and today  c/o headache  9/10 that started today at 11am; per pt "took 2  Tylenol.at 08:00am for headache. Pt denies dizziness/ blurred vision/ SOB. NAD noted at this time. Speech is clear. A/Ox4.

## 2018-08-10 NOTE — ED Provider Notes (Addendum)
[facility] Emergency Department Provider Note  ____________________________________________   I have reviewed the triage vital signs and the nursing notes. Where available I have reviewed prior notes and, if possible and indicated, outside hospital notes.    HISTORY  Chief Complaint Weakness and Chest Pain    HPI Barbara Buckley is a 58 y.o. female with a history of hypertension and tachycardia chronic dysphasia obesity had a, and atypical chest pain in the past with a completely normal heart catheterization 2010, and retreated echo since that time which was also negative, negative Holter monitors aside from PVCs in the past as well for palpitations presents today feeling "just not quite right".  She has some degree of vague symptoms.  She states she has acid indigestion, after eating [place] fried chicken last night, and when she woke this morning she felt that her neck muscles on both sides were tight.  She thought she had slept wrong.  She states her jaw feels uncomfortable, has felt uncomfortable all day without stop since this morning.  She states that she has no chest pain no shortness of breath no pleuritic pain, no abdominal pain no vomiting, she feels a little nauseated sometimes, she feels flushed and hot sometimes, but she has not been sweaty however.  The symptoms have persisted all day and made her very anxious she states.  No exertional symptoms, and again no chest pain per se.  She states he has a burning sensation going up from her stomach because of the fried chicken yesterday.  He does not have any pain "radiating down her arms" in fact she actually describes pain she states that her whole body feels weak and that was what she was trying to describe.    She does not have a story of tobacco abuse and she has a low heart score. Denies exogenous estrogens no personal family history of PE or DVT no recent travel no leg swelling no recent surgery  Past  Medical History:  Diagnosis Date  . Hypertension   . Tachycardia     There are no active problems to display for this patient.   History reviewed. No pertinent surgical history.  Prior to Admission medications   Medication Sig Start Date End Date Taking? Authorizing Provider  Calcium Carbonate-Vitamin D 500-125 MG-UNIT TABS Take by mouth.    [provider]  losartan (COZAAR) 100 MG tablet Take 100 mg by mouth daily. 06/19/18   [provider]  Methylsulfonylmethane (MSM) 1000 MG CAPS Take by mouth.    [provider]  metoprolol succinate (TOPROL-XL) 25 MG 24 hr tablet Take 25 mg by mouth daily. 06/19/18   [provider]  Multiple Vitamins-Minerals (MULTIVITAMIN ADULT PO) Take by mouth. 02/27/09   [provider]  Turmeric, Curcuma Longa, (CURCUMIN) POWD Use 500 mg once daily    [provider]  valACYclovir (VALTREX) 1000 MG tablet TAKE 2 TABLETS BY MOUTH EVERY 12 HOURS START AT ONSET OF SYMPTOMS 06/20/18   [provider]  Vitamin D, Ergocalciferol, (DRISDOL) 1.25 MG (50000 UT) CAPS capsule TAKE ONE CAPSULE BY MOUTH ONCE A WEEK 05/14/17   [provider]    Allergies Lisinopril  Family History  Problem Relation Age of Onset  . Breast cancer Paternal Aunt        x 2  ? age    Social History Social History   Tobacco Use  . Smoking status: Never Smoker  . Smokeless tobacco: Never Used  Substance Use  Topics  . Alcohol use: Yes    Frequency: Never    Comment: occasional 0-2 drinks per month  . Drug use: Not on file    Review of Systems Constitutional: No fever/chills Eyes: No visual changes. ENT: No sore throat. No stiff neck no neck pain Cardiovascular: Denies chest pain. Respiratory: Denies shortness of breath. Gastrointestinal:   no vomiting.  No diarrhea.  No constipation. Genitourinary: Negative for dysuria. Musculoskeletal: Negative lower extremity swelling Skin: Negative for  rash. Neurological: Negative for severe headaches, focal weakness or numbness.   ____________________________________________   PHYSICAL EXAM:  VITAL SIGNS: ED Triage Vitals [08/10/18 1615]  Enc Vitals Group     BP (!) 168/88     Pulse Rate (!) 102     Resp 20     Temp      Temp src      SpO2 94 %     Weight 260 lb (117.9 kg)     Height 5\' 6"  (1.676 m)     Head Circumference      Peak Flow      Pain Score 7     Pain Loc      Pain Edu?      Excl. in GC?     Constitutional: Alert and oriented. Well appearing and in no acute distress. Eyes: Conjunctivae are normal Head: Atraumatic HEENT: No congestion/rhinnorhea. Mucous membranes are moist.  Oropharynx non-erythematous Neck:   Nontender with no meningismus, no masses, no stridor Cardiovascular: Normal rate, regular rhythm. Grossly normal heart sounds.  Good peripheral circulation. Respiratory: Normal respiratory effort.  No retractions. Lungs CTAB. Abdominal: Soft and nontender. No distention. No guarding no rebound Back:  There is no focal tenderness or step off.  there is no midline tenderness there are no lesions noted. there is no CVA tenderness Musculoskeletal: No lower extremity tenderness, no upper extremity tenderness. No joint effusions, no DVT signs strong distal pulses no edema Neurologic:  Normal speech and language. No gross focal neurologic deficits are appreciated.  Skin:  Skin is warm, dry and intact. No rash noted. Psychiatric: Mood and affect are right anxious.  Patient has been signing many times in the room and expresses anxiety about her health. Speech and behavior are normal.  ____________________________________________   LABS (all labs ordered are listed, but only abnormal results are displayed)  Labs Reviewed  BASIC METABOLIC PANEL - Abnormal; Notable for the following components:      Result Value   CO2 20 (*)    Glucose, Bld 118 (*)    BUN 26 (*)    All other components within normal limits   CBC - Abnormal; Notable for the following components:   WBC 11.7 (*)    All other components within normal limits  TROPONIN I  HEPATIC FUNCTION PANEL  LIPASE, BLOOD    Pertinent labs  results that were available during my care of the patient were reviewed by me and considered in my medical decision making (see chart for details). ____________________________________________  EKG  I personally interpreted any EKGs ordered by me or triage  ____________________________________________  RADIOLOGY  Pertinent labs & imaging results that were available during my care of the patient were reviewed by me and considered in my medical decision making (see chart for details). If possible, patient and/or family made aware of any abnormal findings.  Dg Chest 2 View  Result Date: 08/10/2018 CLINICAL DATA:  Weakness.  Feeling unwell. EXAM: CHEST - 2 VIEW COMPARISON:  Chest radiograph June 27, 2018 FINDINGS: Cardiomediastinal silhouette is normal. No pleural effusions or focal consolidations. Faint lingular linear densities. Trachea projects midline and there is no pneumothorax. Soft tissue planes and included osseous structures are non-suspicious. IMPRESSION: Minimal lingular atelectasis. Electronically Signed   By: Awilda Metro M.D.   On: 08/10/2018 17:01   ____________________________________________    PROCEDURES  Procedure(s) performed: None  Procedures  Critical Care performed: None  ____________________________________________   INITIAL IMPRESSION / ASSESSMENT AND PLAN / ED COURSE  Pertinent labs & imaging results that were available during my care of the patient were reviewed by me and considered in my medical decision making (see chart for details).  Sent presenting today with several different complaints, she is very worried about her health today however she has a long history of atypical chest pain and atypical cardiac seeming symptoms with extensive negative work-up  including a negative heart catheterization for which she describes a similar event.  Is a very atypical symptoms, and they have been present all day long.  Certainly no evidence of CVA she is neurologically intact no evidence of PE, she has risk factors were no dyspnea and actually no chest pain aside from what she describes as the [place] fried chicken associated reflux.  She states that this is not exertional pain is somewhat positional is worse when she lies flat, low suspicion for ACS but we will send a second set of cardiac markers, he does have a low heart score, is a very atypical story low suspicion for myocarditis endocarditis pericarditis pneumonia pneumothorax or referred abdominal pain, her abdomen is benign nonetheless we will send LFTs and lipase because of this complaint of a burning sensation coming up from her stomach.  If all that is negative is my hope that we can get her in to see her cardiologist.  She does have an appoint with her cardiologist she states tomorrow and at this point admitting to her after unrelenting symptoms for 12 hours and a completely reassuring work-up would likely not be in her best interest in the height of flu season.  ----------------------------------------- 7:51 PM on 08/10/2018 -----------------------------------------  Repeat EKG shows sinus rhythm, rate 79 bpm no ST elevation depression nonspecific ST changes no acute ischemia.  We are awaiting second troponin, patient is eager to go home she states she is feeling much better and reassured, she does not wish to be admitted which I do not think is unreasonable.  She has a close outpatient follow-up with her cardiologist.   ----------------------------------------- 8:15 PM on 08/10/2018 -----------------------------------------  EKG and troponin remained reassuring, I talked to the patient about our work-up here.  I do not think a CT scan is indicated.  Nothing to suggest dissection or PE, and we talked  about admission versus discharge as there is no much else I can do in the ER, her strong preference is to go home.  She and I talked with risk-benefit and alternatives both of going home and of staying and she and her husband want to be discharged they have close outpatient follow-up with heart doctors and they do not want to stay any further.  That I think is not an unreasonable course of action she understands that it does limit our ability to monitor her health and that therefore she needs to be vigilant for any new or worrisome symptoms we had an exhaustive discussion about what those would consist of, and she feels very comfortable with discharge she feels better and she is not sure what  this was she does not think it is her heart either and she is in no acute distress   ____________________________________________   FINAL CLINICAL IMPRESSION(S) / ED DIAGNOSES  Final diagnoses:  None      This chart was dictated using voice recognition software.  Despite best efforts to proofread,  errors can occur which can change meaning.      Jeanmarie Plant, MD 08/10/18 Mikle Bosworth    Jeanmarie Plant, MD 08/10/18 Sable Feil    Jeanmarie Plant, MD 08/10/18 2016

## 2018-08-10 NOTE — Discharge Instructions (Addendum)
Thus far, your work-up is been quite reassuring.  We are unclear exactly as to why you had the symptoms.  You would prefer to go home and I do not think this is unreasonable but it does limit our ability continue to monitor you therefore we do suggest that you need to be vigilant about your health and if you have any new or worrisome symptoms as we discussed please return to the ER.  This would include but not limited to chest pain shortness of breath or other concerns.  Otherwise, please honor your already existing cardiology appointment this week, and try not to over exert yourself in the meantime.

## 2018-08-23 ENCOUNTER — Encounter: Payer: BLUE CROSS/BLUE SHIELD | Admitting: Occupational Therapy

## 2018-08-25 ENCOUNTER — Encounter: Payer: BLUE CROSS/BLUE SHIELD | Admitting: Occupational Therapy

## 2018-09-22 ENCOUNTER — Ambulatory Visit: Payer: BLUE CROSS/BLUE SHIELD | Admitting: Psychiatry

## 2018-09-28 ENCOUNTER — Ambulatory Visit: Payer: BLUE CROSS/BLUE SHIELD | Admitting: Psychiatry

## 2018-10-18 ENCOUNTER — Encounter: Payer: BLUE CROSS/BLUE SHIELD | Admitting: Occupational Therapy

## 2018-10-20 ENCOUNTER — Encounter: Payer: BLUE CROSS/BLUE SHIELD | Admitting: Occupational Therapy

## 2018-12-13 ENCOUNTER — Encounter: Payer: BLUE CROSS/BLUE SHIELD | Admitting: Occupational Therapy

## 2018-12-15 ENCOUNTER — Encounter: Payer: BLUE CROSS/BLUE SHIELD | Admitting: Occupational Therapy

## 2018-12-21 DIAGNOSIS — G4733 Obstructive sleep apnea (adult) (pediatric): Secondary | ICD-10-CM

## 2018-12-21 DIAGNOSIS — I471 Supraventricular tachycardia: Secondary | ICD-10-CM

## 2018-12-21 DIAGNOSIS — I10 Essential (primary) hypertension: Secondary | ICD-10-CM

## 2018-12-21 NOTE — H&P (Addendum)
Chief Complaint:  Obesity for sleeve gastrectomy  History of Present Illness:  Barbara Buckley is an 58 y.o. female who was seen in the office in January and in July for sleeve gastrectomy.  All of her questions have been answered and she is ready for surgery   Past Medical History:  Diagnosis Date  . Hypertension   . Tachycardia     No past surgical history on file.  No current facility-administered medications for this encounter.    Current Outpatient Medications  Medication Sig Dispense Refill  . Calcium Carbonate-Vitamin D 500-125 MG-UNIT TABS Take by mouth.    . losartan (COZAAR) 100 MG tablet Take 100 mg by mouth daily.    . Methylsulfonylmethane (MSM) 1000 MG CAPS Take by mouth.    . metoprolol succinate (TOPROL-XL) 25 MG 24 hr tablet Take 25 mg by mouth daily.    . Multiple Vitamins-Minerals (MULTIVITAMIN ADULT PO) Take by mouth.    . Turmeric, Curcuma Longa, (CURCUMIN) POWD Use 500 mg once daily    . valACYclovir (VALTREX) 1000 MG tablet TAKE 2 TABLETS BY MOUTH EVERY 12 HOURS START AT ONSET OF SYMPTOMS    . Vitamin D, Ergocalciferol, (DRISDOL) 1.25 MG (50000 UT) CAPS capsule TAKE ONE CAPSULE BY MOUTH ONCE A WEEK     Lisinopril Family History  Problem Relation Age of Onset  . Breast cancer Paternal Aunt        x 2  ? age   Social History:   reports that she has never smoked. She has never used smokeless tobacco. She reports current alcohol use. No history on file for drug.   REVIEW OF SYSTEMS : Negative except for see problem list  Physical Exam:   There were no vitals taken for this visit. There is no height or weight on file to calculate BMI.  Gen:  WDWN WF NAD  Neurological: Alert and oriented to person, place, and time. Motor and sensory function is grossly intact  Head: Normocephalic and atraumatic.  Eyes: Conjunctivae are normal. Pupils are equal, round, and reactive to light. No scleral icterus.  Neck: Normal range of motion. Neck supple. No tracheal  deviation or thyromegaly present.  Cardiovascular:  SR without murmurs or gallops.  No carotid bruits Breast:  Not examined Respiratory: Effort normal.  No respiratory distress. No chest wall tenderness. Breath sounds normal.  No wheezes, rales or rhonchi.  Abdomen:  nontender GU:  Not examined Musculoskeletal: Normal range of motion. Extremities are nontender. No cyanosis, edema or clubbing noted Lymphadenopathy: No cervical, preauricular, postauricular or axillary adenopathy is present Skin: Skin is warm and dry. No rash noted. No diaphoresis. No erythema. No pallor. Pscyh: Normal mood and affect. Behavior is normal. Judgment and thought content normal.   LABORATORY RESULTS: No results found for this or any previous visit (from the past 48 hour(s)).   RADIOLOGY RESULTS: No results found.  Problem List: Patient Active Problem List   Diagnosis Date Noted  . SVT (supraventricular tachycardia) ([place]) 12/21/2018  . OSA (obstructive sleep apnea) 12/21/2018  . Essential hypertension 12/21/2018    Assessment & Plan: Obesity with BMI of 43--for sleeve gastrectomy    Matt B. Hassell Done, MD, [facility] [phone] beeper [phone]  12/21/2018 2:02 PM

## 2018-12-23 ENCOUNTER — Ambulatory Visit: Payer: BC Managed Care – PPO

## 2018-12-26 ENCOUNTER — Ambulatory Visit: Payer: BLUE CROSS/BLUE SHIELD

## 2018-12-26 ENCOUNTER — Other Ambulatory Visit: Payer: Self-pay

## 2018-12-26 ENCOUNTER — Encounter: Payer: BC Managed Care – PPO | Attending: Surgery | Admitting: Skilled Nursing Facility1

## 2018-12-26 DIAGNOSIS — E669 Obesity, unspecified: Secondary | ICD-10-CM | POA: Diagnosis present

## 2018-12-27 NOTE — Progress Notes (Signed)
Pre-Operative Nutrition Class:  Appt start time: 5694   End time:  1830.  Patient was seen on 12/26/2018 for Pre-Operative Bariatric Surgery Education at [facility].   Surgery date: 01/09/2019 Surgery type: sleeve Start weight at [facility]:  Weight today: 266.8  The following the learning objectives were met by the patient during this course:  Identify Pre-Op Dietary Goals and will begin 2 weeks pre-operatively  Identify appropriate sources of fluids and proteins   State protein recommendations and appropriate sources pre and post-operatively  Identify Post-Operative Dietary Goals and will follow for 2 weeks post-operatively  Identify appropriate multivitamin and calcium sources  Describe the need for physical activity post-operatively and will follow MD recommendations  State when to call healthcare provider regarding medication questions or post-operative complications  Handouts given during class include:  Pre-Op Bariatric Surgery Diet Handout  Protein Shake Handout  Post-Op Bariatric Surgery Nutrition Handout  BELT Program Information Flyer  Support Group Information Flyer  [facility] Bariatric Supplements Price List  Follow-Up Plan: Patient will follow-up at [facility] 2 weeks post operatively for diet advancement per MD.

## 2018-12-28 ENCOUNTER — Telehealth: Payer: Self-pay | Admitting: Dietician

## 2018-12-28 NOTE — Telephone Encounter (Signed)
Spoke with patient by phone regarding her questions about BELT bariatric exercise program, and RD follow-up after surgery. She states her surgery is scheduled for 01/09/19, so scheduled her RD post-op visit for 01/23/19 at 10:00am. Advised her to contact [place] for information on accessing virtual BELT classes.

## 2019-01-04 ENCOUNTER — Encounter (HOSPITAL_COMMUNITY): Payer: Self-pay

## 2019-01-04 NOTE — Progress Notes (Signed)
The following are in epic: EKG 08/12/2018 CXR 08/10/2018

## 2019-01-04 NOTE — Patient Instructions (Addendum)
DUE TO COVID-19 ONLY ONE VISITOR IS ALLOWED IN THE WAITING ROOM ONLY AT THIS TIME   COVID SWAB TESTING MUST BE COMPLETED ON: TODAY, January 05, 2019 AT 3:20PM.  [address], [place] [facility] enter pre surgical testing line (Must self quarantine after testing. Follow instructions on handout.)             Your procedure is scheduled on: Monday, AUG. 3, 2020   [facility] Main  Entr[facility]   Report to Short Stay at 5:30 AM    Call this number if you have problems the morning of surgery [phone]   Bring Cpap mask and tubing day of surgery   NO SOLID FOOD AFTER 600 PM THE NIGHT BEFORE YOUR SURGERY. YOU MAY DRINK CLEAR FLUIDS.   CLEAR LIQUID DIET  Foods Allowed                                                                     Foods Excluded  Water, Black Coffee and tea, regular and decaf                             liquids that you cannot  Plain Jell-O in any flavor  (No red)                                           see through such as: Fruit ices (not with fruit pulp)                                     milk, soups, orange juice  Iced Popsicles (No red)                                    All solid food Carbonated beverages, regular and diet                                    Apple juices Sports drinks like Gatorade (No red) Lightly seasoned clear broth or consume(fat free) Sugar, honey syrup  Sample Menu Breakfast                                Lunch                                     Supper Cranberry juice                    Beef broth                            Chicken broth Jell-O  Grape juice                           Apple juice Coffee or tea                        Jell-O                                      Popsicle                                                Coffee or tea                        Coffee or tea   DRINK G2 DRINK BEFORE YOU LEAVE HOME WILL BE THE LAST FLUIDS YOU DRINK BEFORE  SURGERY.    Brush your teeth the morning of surgery.   Do NOT smoke after Midnight   Take these medicines the morning of surgery with A SIP OF WATER: METOPROLOL   USE EYE DROPS PER NORMAL ROUTINE DAY OF SURGERY                               You may not have any metal on your body including hair pins, jewelry, and body piercings             Do not wear make-up, lotions, powders, perfumes/cologne, or deodorant             Do not wear nail polish.  Do not shave  48 hours prior to surgery.                Do not bring valuables to the hospital. [facility] IS NOT             RESPONSIBLE   FOR VALUABLES.   Contacts, dentures or bridgework may not be worn into surgery.   Bring small overnight bag day of surgery.    Special Instructions: Bring a copy of your healthcare power of attorney and living will documents         the day of surgery if you haven't scanned them in before.   PAIN IS EXPECTED AFTER SURGERY AND WILL NOT BE COMPLETELY ELIMINATED. AMBULATION AND TYLENOL WILL HELP REDUCE INCISIONAL AND GAS PAIN. MOVEMENT IS KEY!   YOU ARE EXPECTED TO BE OUT OF BED WITHIN 4 HOURS OF ADMISSION TO YOUR PATIENT ROOM.   SITTING IN THE RECLINER THROUGHOUT THE DAY IS IMPORTANT FOR DRINKING FLUIDS AND MOVING GAS THROUGHOUT THE GI TRACT.   COMPRESSION ST[facility] UNLESS YOU ARE WALKING.    INCENTIVE SPIROMETER SHOULD BE USED EVERY HOUR WHILE AWAKE TO DECREASE POST-OPERATIVE COMPLICATIONS SUCH AS PNEUMONIA.   WHEN DISCHARGED HOME, IT IS IMPORTANT TO CONTINUE TO WALK EVERY HOUR AND USE THE INCENTIVE SPIROMETER EVERY HOUR.               Please read over the following fact sheets you were given: [facility] - Preparing for Surgery Before surgery, you can play an important role.  Because skin is not sterile, your skin needs to be as free  of germs as possible.  You can reduce the number of germs on your skin by washing with CHG (chlorahexidine gluconate) soap  before surgery.  CHG is an antiseptic cleaner which kills germs and bonds with the skin to continue killing germs even after washing. Please DO NOT use if you have an allergy to CHG or antibacterial soaps.  If your skin becomes reddened/irritated stop using the CHG and inform your nurse when you arrive at Short Stay. Do not shave (including legs and underarms) for at least 48 hours prior to the first CHG shower.  You may shave your face/neck.  Please follow these instructions carefully:  1.  Shower with CHG Soap the night before surgery and the  morning of surgery.  2.  If you choose to wash your hair, wash your hair first as usual with your normal  shampoo.  3.  After you shampoo, rinse your hair and body thoroughly to remove the shampoo.                             4.  Use CHG as you would any other liquid soap.  You can apply chg directly to the skin and wash.  Gently with a scrungie or clean washcloth.  5.  Apply the CHG Soap to your body ONLY FROM THE NECK DOWN.   Do   not use on face/ open                           Wound or open sores. Avoid contact with eyes, ears mouth and   genitals (private parts).                       Wash face,  Genitals (private parts) with your normal soap.             6.  Wash thoroughly, paying special attention to the area where your    surgery  will be performed.  7.  Thoroughly rinse your body with warm water from the neck down.  8.  DO NOT shower/wash with your normal soap after using and rinsing off the CHG Soap.                9.  Pat yourself dry with a clean towel.            10.  Wear clean pajamas.            11.  Place clean sheets on your bed the night of your first shower and do not  sleep with pets. Day of Surgery : Do not apply any lotions/deodorants the morning of surgery.  Please wear clean clothes to the hospital/surgery center.  FAILURE TO FOLLOW THESE INSTRUCTIONS MAY RESULT IN THE CANCELLATION OF YOUR SURGERY  PATIENT  SIGNATURE_________________________________  NURSE SIGNATURE__________________________________  ________________________________________________________________________  WHAT IS A BLOOD TRANSFUSION? Blood Transfusion Information  A transfusion is the replacement of blood or some of its parts. Blood is made up of multiple cells which provide different functions.  Red blood cells carry oxygen and are used for blood loss replacement.  White blood cells fight against infection.  Platelets control bleeding.  Plasma helps clot blood.  Other blood products are available for specialized needs, such as hemophilia or other clotting disorders. BEFORE THE TRANSFUSION  Who gives blood for transfusions?   Healthy volunteers who are fully evaluated to make sure  their blood is safe. This is blood bank blood. Transfusion therapy is the safest it has ever been in the practice of medicine. Before blood is taken from a donor, a complete history is taken to make sure that person has no history of diseases nor engages in risky social behavior (examples are intravenous drug use or sexual activity with multiple partners). The donor's travel history is screened to minimize risk of transmitting infections, such as malaria. The donated blood is tested for signs of infectious diseases, such as HIV and hepatitis. The blood is then tested to be sure it is compatible with you in order to minimize the chance of a transfusion reaction. If you or a relative donates blood, this is often done in anticipation of surgery and is not appropriate for emergency situations. It takes many days to process the donated blood. RISKS AND COMPLICATIONS Although transfusion therapy is very safe and saves many lives, the main dangers of transfusion include:   Getting an infectious disease.  Developing a transfusion reaction. This is an allergic reaction to something in the blood you were given. Every precaution is taken to prevent  this. The decision to have a blood transfusion has been considered carefully by your caregiver before blood is given. Blood is not given unless the benefits outweigh the risks. AFTER THE TRANSFUSION  Right after receiving a blood transfusion, you will usually feel much better and more energetic. This is especially true if your red blood cells have gotten low (anemic). The transfusion raises the level of the red blood cells which carry oxygen, and this usually causes an energy increase.  The nurse administering the transfusion will monitor you carefully for complications. HOME CARE INSTRUCTIONS  No special instructions are needed after a transfusion. You may find your energy is better. Speak with your caregiver about any limitations on activity for underlying diseases you may have. SEEK MEDICAL CARE IF:   Your condition is not improving after your transfusion.  You develop redness or irritation at the intravenous (IV) site. SEEK IMMEDIATE MEDICAL CARE IF:  Any of the following symptoms occur over the next 12 hours:  Shaking chills.  You have a temperature by mouth above 102 F (38.9 C), not controlled by medicine.  Chest, back, or muscle pain.  People around you feel you are not acting correctly or are confused.  Shortness of breath or difficulty breathing.  Dizziness and fainting.  You get a rash or develop hives.  You have a decrease in urine output.  Your urine turns a dark color or changes to pink, red, or brown. Any of the following symptoms occur over the next 10 days:  You have a temperature by mouth above 102 F (38.9 C), not controlled by medicine.  Shortness of breath.  Weakness after normal activity.  The white part of the eye turns yellow (jaundice).  You have a decrease in the amount of urine or are urinating less often.  Your urine turns a dark color or changes to pink, red, or brown. Document Released: 05/22/2000 Document Revised: 08/17/2011 Document  Reviewed: 01/09/2008 [facility] Patient Information 2014 [place].

## 2019-01-05 ENCOUNTER — Other Ambulatory Visit: Payer: Self-pay

## 2019-01-05 ENCOUNTER — Encounter (HOSPITAL_COMMUNITY): Payer: Self-pay

## 2019-01-05 ENCOUNTER — Other Ambulatory Visit (HOSPITAL_COMMUNITY)
Admission: RE | Admit: 2019-01-05 | Discharge: 2019-01-05 | Disposition: A | Discharge status: Home / Self Care | Payer: BC Managed Care – PPO | Source: Ambulatory Visit | Attending: Surgery | Admitting: Surgery

## 2019-01-05 ENCOUNTER — Encounter (HOSPITAL_COMMUNITY)
Admission: RE | Admit: 2019-01-05 | Discharge: 2019-01-05 | Disposition: A | Discharge status: Home / Self Care | Payer: BC Managed Care – PPO | Source: Ambulatory Visit | Attending: Surgery | Admitting: Surgery

## 2019-01-05 DIAGNOSIS — Z01812 Encounter for preprocedural laboratory examination: Secondary | ICD-10-CM | POA: Diagnosis not present

## 2019-01-05 DIAGNOSIS — Z20828 Contact with and (suspected) exposure to other viral communicable diseases: Secondary | ICD-10-CM | POA: Insufficient documentation

## 2019-01-05 HISTORY — DX: Vitamin D deficiency, unspecified: E55.9

## 2019-01-05 HISTORY — DX: Restless legs syndrome: G25.81

## 2019-01-05 HISTORY — DX: Rash and other nonspecific skin eruption: R21

## 2019-01-05 HISTORY — DX: Other specified postprocedural states: R11.2

## 2019-01-05 HISTORY — DX: Personal history of other infectious and parasitic diseases: Z86.19

## 2019-01-05 HISTORY — DX: Other specified postprocedural states: Z98.890

## 2019-01-05 HISTORY — DX: Obesity, unspecified: E66.9

## 2019-01-05 HISTORY — DX: Anxiety disorder, unspecified: F41.9

## 2019-01-05 HISTORY — DX: Obstructive sleep apnea (adult) (pediatric): G47.33

## 2019-01-05 HISTORY — DX: Lymphedema, not elsewhere classified: I89.0

## 2019-01-05 HISTORY — DX: Migraine, unspecified, not intractable, without status migrainosus: G43.909

## 2019-01-05 HISTORY — DX: Obstructive sleep apnea (adult) (pediatric): Z99.89

## 2019-01-05 HISTORY — DX: Chronic kidney disease, stage 3 unspecified: N18.30

## 2019-01-05 HISTORY — DX: Gastro-esophageal reflux disease without esophagitis: K21.9

## 2019-01-05 LAB — CBC WITH DIFFERENTIAL/PLATELET
Abs Immature Granulocytes: 0.01 10*3/uL (ref 0.00–0.07)
Basophils Absolute: 0 10*3/uL (ref 0.0–0.1)
Basophils Relative: 1 %
Eosinophils Absolute: 0.1 10*3/uL (ref 0.0–0.5)
Eosinophils Relative: 3 %
HCT: 40.8 % (ref 36.0–46.0)
Hemoglobin: 13.3 g/dL (ref 12.0–15.0)
Immature Granulocytes: 0 %
Lymphocytes Relative: 23 %
Lymphs Abs: 1.3 10*3/uL (ref 0.7–4.0)
MCH: 28.7 pg (ref 26.0–34.0)
MCHC: 32.6 g/dL (ref 30.0–36.0)
MCV: 88.1 fL (ref 80.0–100.0)
Monocytes Absolute: 0.5 10*3/uL (ref 0.1–1.0)
Monocytes Relative: 9 %
Neutro Abs: 3.6 10*3/uL (ref 1.7–7.7)
Neutrophils Relative %: 64 %
Platelets: 221 10*3/uL (ref 150–400)
RBC: 4.63 MIL/uL (ref 3.87–5.11)
RDW: 14.4 % (ref 11.5–15.5)
WBC: 5.5 10*3/uL (ref 4.0–10.5)
nRBC: 0 % (ref 0.0–0.2)

## 2019-01-05 LAB — COMPREHENSIVE METABOLIC PANEL
ALT: 22 U/L (ref 0–44)
AST: 22 U/L (ref 15–41)
Albumin: 4.6 g/dL (ref 3.5–5.0)
Alkaline Phosphatase: 65 U/L (ref 38–126)
Anion gap: 10 (ref 5–15)
BUN: 28 mg/dL — ABNORMAL HIGH (ref 6–20)
CO2: 21 mmol/L — ABNORMAL LOW (ref 22–32)
Calcium: 9.6 mg/dL (ref 8.9–10.3)
Chloride: 108 mmol/L (ref 98–111)
Creatinine, Ser: 0.76 mg/dL (ref 0.44–1.00)
GFR calc Af Amer: >60 mL/min (ref >60)
GFR calc non Af Amer: >60 mL/min (ref >60)
Glucose, Bld: 91 mg/dL (ref 70–99)
Potassium: 4.1 mmol/L (ref 3.5–5.1)
Sodium: 139 mmol/L (ref 135–145)
Total Bilirubin: 0.6 mg/dL (ref 0.3–1.2)
Total Protein: 8 g/dL (ref 6.5–8.1)

## 2019-01-05 LAB — ABO/RH: ABO/RH(D): O POS

## 2019-01-05 NOTE — Progress Notes (Signed)
SPOKE W/  Barbara Buckley    SCREENING SYMPTOMS OF COVID 19:   COUGH--NO  RUNNY NOSE--- NO  SORE THROAT---NO  NASAL CONGESTION----NO  SNEEZING----NO  SHORTNESS OF BREATH---NO  DIFFICULTY BREATHING---NO  TEMP >100.0 -----NO  UNEXPLAINED BODY ACHES------NO  CHILLS -------- NO  HEADACHES ---------NO  LOSS OF SMELL/ TASTE --------NO    HAVE YOU OR ANY FAMILY MEMBER TRAVELLED PAST 14 DAYS OUT OF THE   COUNTY---[place]Travelled to [place] 12/24/2018 COUNTRY----NO  HAVE YOU OR ANY FAMILY MEMBER BEEN EXPOSED TO ANYONE WITH COVID 19? NO

## 2019-01-06 LAB — SARS CORONAVIRUS 2 (TAT 6-24 HRS): SARS Coronavirus 2: NEGATIVE

## 2019-01-06 NOTE — Progress Notes (Signed)
Anesthesia Chart Review   Case: [id number] Date/Time: 01/09/19 0715   Procedure: LAPAROSCOPIC GASTRIC SLEEVE RESECTION, UPPER ENDO, ERAS Pathway (N/A )   Anesthesia type: General   Pre-op diagnosis: Morbid Obesity, HTN, OSA   Location: [facility] ROOM 01 / [facility]   Surgeon: Johnathan Hausen, MD      DISCUSSION:58 y.o. never smoker with h/o PONV, HTN, GERD, CKD Stage III, OSA on CPAP, migraines, restless leg, anxiety, morbid obesity scheduled for above procedure 01/09/2019 with Dr. Johnathan Hausen.   Pt seen in ED 08/10/2018 due to chest pain, normal EKG, negative troponins, discharged stable.  She followed up with cardiologist, Dr. Otto Herb, 08/12/2018.   Per his OV note, "No evidence for CAD or ACS.  No cardiac contraindication for gastric bypass surgery.  BP at goal, lipid profile to assess need for a statin.  Daily exercise.  F/u 1 year."  Anticipate pt can proceed with planned procedure barring acute status change.   VS: BP (!) 141/78   Pulse 79   Temp 37 C (Oral)   Resp 16   Ht 5\' 6"  (1.676 m)   Wt 117.9 kg   SpO2 97%   BMI 41.97 kg/m   PROVIDERS: Ward, Honor Loh, MD is PCP    LABS: Labs reviewed: Acceptable for surgery. (all labs ordered are listed, but only abnormal results are displayed)  Labs Reviewed  COMPREHENSIVE METABOLIC PANEL - Abnormal; Notable for the following components:      Result Value   CO2 21 (*)    BUN 28 (*)    All other components within normal limits  CBC WITH DIFFERENTIAL/PLATELET  TYPE AND SCREEN  ABO/RH     IMAGES: Chest Xray 08/10/2018 FINDINGS: Cardiomediastinal silhouette is normal. No pleural effusions or focal consolidations. Faint lingular linear densities. Trachea projects midline and there is no pneumothorax. Soft tissue planes and included osseous structures are non-suspicious.  IMPRESSION: Minimal lingular atelectasis.  EKG: 08/12/2018 Rate 100 bpm Normal sinus rhythm  Cannot rule out anterior infarct, age undetermined    CV: Holter 09/17/2016  The patient was in sinus rhythm, sinus bradycardia, sinus tachycardia. Average HR = 99 bpm. Ventricular ectopic activity comprised 4% of total beats and consisted of multifocal PVCs (strips 5, 15), couplets (strips 6, 16) bigeminy (strip 11) and trigeminy. Supraventricular ectopic activity consisted of 1 PAC There were no pauses greater than 2.0 seconds noted. 6) Symptoms of "feel beating" correlated with sinus tachycardia with PVC.  Event button activations correlated with sinus tachycardia.  ECHO - 06/20/2013 NORMAL LEFT VENTRICULAR SYSTOLIC FUNCTION NORMAL RIGHT VENTRICULAR SYSTOLIC FUNCTION VALVULAR REGURGITATION: TRIVIAL MR, TRIVIAL PR, TRIVIAL TR, NO VALVULAR STENOSIS Compared with prior Echo study on 02/27/2009: IMPROVED LVEF.  Past Medical History:  Diagnosis Date  . Anxiety    at night  . CKD (chronic kidney disease), stage III ([place])   . GERD (gastroesophageal reflux disease)    history of  . History of cold sores   . Hypertension   . Lymphedema    Bilateral lower extremity  . Migraines    history of  . Obesity   . OSA on CPAP   . PONV (postoperative nausea and vomiting)   . Restless leg syndrome   . Skin rash    due to stress  . Tachycardia   . Vitamin D deficiency     Past Surgical History:  Procedure Laterality Date  . BLADDER SUSPENSION    . CARDIAC CATHETERIZATION    . CESAREAN SECTION  x2  . COLONOSCOPY    . FOOT SURGERY Right    x2  . TONSILLECTOMY AND ADENOIDECTOMY    . UPPER GI ENDOSCOPY    . WISDOM TOOTH EXTRACTION      MEDICATIONS: . ASTAXANTHIN PO  . acetaminophen (TYLENOL) 325 MG tablet  . Ascorbic Acid (VITAMIN C) 1000 MG tablet  . b complex vitamins tablet  . Brimonidine Tartrate (LUMIFY) 0.025 % SOLN  . Flaxseed, Linseed, (FLAX SEED OIL PO)  . GLUCOSAMINE-CHONDROITIN PO  . losartan (COZAAR) 100 MG tablet  . Methylsulfonylmethane (MSM) 1000 MG CAPS  . metoprolol succinate (TOPROL-XL) 25 MG 24 hr tablet   . Multiple Vitamins-Minerals (BARIATRIC MULTIVITAMINS/IRON PO)  . Turmeric 500 MG CAPS  . zinc gluconate 50 MG tablet   No current facility-administered medications for this encounter.      Janey GentaJessica Gwendlyn Hanback, PA-C [facility] [phone] 01/06/19  10:58 AM

## 2019-01-06 NOTE — Anesthesia Preprocedure Evaluation (Addendum)
Anesthesia Evaluation  Patient identified by MRN, date of birth, ID band Patient awake    Reviewed: Allergy & Precautions, NPO status , Patient's Chart, lab work & pertinent test results  History of Anesthesia Complications (+) PONV  Airway Mallampati: II  TM Distance: >3 FB Neck ROM: Full    Dental no notable dental hx.    Pulmonary sleep apnea and Continuous Positive Airway Pressure Ventilation ,    Pulmonary exam normal breath sounds clear to auscultation       Cardiovascular hypertension, negative cardio ROS Normal cardiovascular exam Rhythm:Regular Rate:Normal     Neuro/Psych negative neurological ROS  negative psych ROS   GI/Hepatic Neg liver ROS, GERD  ,  Endo/Other  Morbid obesity  Renal/GU negative Renal ROS  negative genitourinary   Musculoskeletal negative musculoskeletal ROS (+)   Abdominal   Peds negative pediatric ROS (+)  Hematology negative hematology ROS (+)   Anesthesia Other Findings   Reproductive/Obstetrics negative OB ROS                            Anesthesia Physical Anesthesia Plan  ASA: III  Anesthesia Plan: General   Post-op Pain Management:    Induction: Intravenous and Rapid sequence  PONV Risk Score and Plan: 4 or greater and Ondansetron, Dexamethasone, Midazolam and Scopolamine patch - Pre-op  Airway Management Planned: Oral ETT  Additional Equipment:   Intra-op Plan:   Post-operative Plan: Extubation in OR  Informed Consent: I have reviewed the patients History and Physical, chart, labs and discussed the procedure including the risks, benefits and alternatives for the proposed anesthesia with the patient or authorized representative who has indicated his/her understanding and acceptance.     Dental advisory given  Plan Discussed with: CRNA and Surgeon  Anesthesia Plan Comments: (See PAT note 01/05/2019, Konrad Felix, PA-C)        Anesthesia Quick Evaluation

## 2019-01-08 MED ORDER — BUPIVACAINE LIPOSOME 1.3 % IJ SUSP
20.0000 mL | Freq: Once | INTRAMUSCULAR | Status: DC
  Filled 2019-01-08: qty 20

## 2019-01-09 ENCOUNTER — Inpatient Hospital Stay (HOSPITAL_COMMUNITY)
Admission: RE | Admit: 2019-01-09 | Discharge: 2019-01-10 | DRG: 620 | Disposition: A | Discharge status: Home / Self Care | Payer: BC Managed Care – PPO | Attending: Surgery | Admitting: Surgery

## 2019-01-09 ENCOUNTER — Inpatient Hospital Stay (HOSPITAL_COMMUNITY): Payer: BC Managed Care – PPO | Admitting: Physician Assistant

## 2019-01-09 ENCOUNTER — Other Ambulatory Visit: Payer: Self-pay

## 2019-01-09 ENCOUNTER — Encounter (HOSPITAL_COMMUNITY): Payer: Self-pay | Admitting: *Deleted

## 2019-01-09 ENCOUNTER — Encounter (HOSPITAL_COMMUNITY)
Admission: RE | Disposition: A | Discharge status: Home / Self Care | Payer: Self-pay | Source: Home / Self Care | Attending: Surgery

## 2019-01-09 ENCOUNTER — Inpatient Hospital Stay (HOSPITAL_COMMUNITY): Payer: BC Managed Care – PPO | Admitting: Registered Nurse

## 2019-01-09 DIAGNOSIS — Z7289 Other problems related to lifestyle: Secondary | ICD-10-CM | POA: Diagnosis not present

## 2019-01-09 DIAGNOSIS — Z79899 Other long term (current) drug therapy: Secondary | ICD-10-CM | POA: Diagnosis not present

## 2019-01-09 DIAGNOSIS — G4733 Obstructive sleep apnea (adult) (pediatric): Secondary | ICD-10-CM

## 2019-01-09 DIAGNOSIS — Z9884 Bariatric surgery status: Secondary | ICD-10-CM

## 2019-01-09 DIAGNOSIS — Z6841 Body Mass Index (BMI) 40.0 and over, adult: Secondary | ICD-10-CM

## 2019-01-09 DIAGNOSIS — I471 Supraventricular tachycardia, unspecified: Secondary | ICD-10-CM

## 2019-01-09 DIAGNOSIS — I10 Essential (primary) hypertension: Secondary | ICD-10-CM | POA: Diagnosis present

## 2019-01-09 DIAGNOSIS — K219 Gastro-esophageal reflux disease without esophagitis: Secondary | ICD-10-CM | POA: Diagnosis present

## 2019-01-09 HISTORY — PX: LAPAROSCOPIC GASTRIC SLEEVE RESECTION: SHX5895

## 2019-01-09 LAB — TYPE AND SCREEN
ABO/RH(D): O POS
Antibody Screen: NEGATIVE

## 2019-01-09 LAB — CBC
HCT: 41.5 % (ref 36.0–46.0)
Hemoglobin: 13.1 g/dL (ref 12.0–15.0)
MCH: 28.6 pg (ref 26.0–34.0)
MCHC: 31.6 g/dL (ref 30.0–36.0)
MCV: 90.6 fL (ref 80.0–100.0)
Platelets: 190 10*3/uL (ref 150–400)
RBC: 4.58 MIL/uL (ref 3.87–5.11)
RDW: 14.3 % (ref 11.5–15.5)
WBC: 7.6 10*3/uL (ref 4.0–10.5)
nRBC: 0 % (ref 0.0–0.2)

## 2019-01-09 LAB — CREATININE, SERUM
Creatinine, Ser: 0.88 mg/dL (ref 0.44–1.00)
GFR calc Af Amer: >60 mL/min (ref >60)
GFR calc non Af Amer: >60 mL/min (ref >60)

## 2019-01-09 LAB — PREGNANCY, URINE: Preg Test, Ur: NEGATIVE

## 2019-01-09 SURGERY — GASTRECTOMY, SLEEVE, LAPAROSCOPIC
Anesthesia: General | Site: Abdomen

## 2019-01-09 MED ORDER — ACETAMINOPHEN 500 MG PO TABS
1000.0000 mg | ORAL_TABLET | ORAL | Status: AC
  Administered 2019-01-09: 1000 mg via ORAL
  Filled 2019-01-09: qty 2

## 2019-01-09 MED ORDER — MIDAZOLAM HCL 5 MG/5ML IJ SOLN
INTRAMUSCULAR | Status: DC | PRN
  Administered 2019-01-09: 2 mg via INTRAVENOUS

## 2019-01-09 MED ORDER — EPHEDRINE SULFATE-NACL 50-0.9 MG/10ML-% IV SOSY
PREFILLED_SYRINGE | INTRAVENOUS | Status: DC | PRN
  Administered 2019-01-09: 5 mg via INTRAVENOUS

## 2019-01-09 MED ORDER — LIDOCAINE HCL 2 % IJ SOLN
INTRAMUSCULAR | Status: AC
  Filled 2019-01-09: qty 20

## 2019-01-09 MED ORDER — FENTANYL CITRATE (PF) 100 MCG/2ML IJ SOLN
INTRAMUSCULAR | Status: AC
  Filled 2019-01-09: qty 2

## 2019-01-09 MED ORDER — LIDOCAINE 2% (20 MG/ML) 5 ML SYRINGE
INTRAMUSCULAR | Status: DC | PRN
  Administered 2019-01-09: 100 mg via INTRAVENOUS

## 2019-01-09 MED ORDER — FENTANYL CITRATE (PF) 100 MCG/2ML IJ SOLN
INTRAMUSCULAR | Status: DC | PRN
  Administered 2019-01-09: 50 ug via INTRAVENOUS
  Administered 2019-01-09: 25 ug via INTRAVENOUS

## 2019-01-09 MED ORDER — GABAPENTIN 300 MG PO CAPS
300.0000 mg | ORAL_CAPSULE | ORAL | Status: AC
  Administered 2019-01-09: 300 mg via ORAL
  Filled 2019-01-09: qty 1

## 2019-01-09 MED ORDER — SUGAMMADEX SODIUM 500 MG/5ML IV SOLN
INTRAVENOUS | Status: AC
  Filled 2019-01-09: qty 5

## 2019-01-09 MED ORDER — KCL IN DEXTROSE-NACL 20-5-0.45 MEQ/L-%-% IV SOLN
INTRAVENOUS | Status: DC
  Administered 2019-01-09 (×2): via INTRAVENOUS
  Filled 2019-01-09 (×3): qty 1000

## 2019-01-09 MED ORDER — LACTATED RINGERS IR SOLN
Status: DC | PRN
  Administered 2019-01-09: 1000 mL

## 2019-01-09 MED ORDER — PANTOPRAZOLE SODIUM 40 MG IV SOLR
40.0000 mg | Freq: Every day | INTRAVENOUS | Status: DC
  Administered 2019-01-09: 40 mg via INTRAVENOUS
  Filled 2019-01-09: qty 40

## 2019-01-09 MED ORDER — EPHEDRINE 5 MG/ML INJ
INTRAVENOUS | Status: AC
  Filled 2019-01-09: qty 10

## 2019-01-09 MED ORDER — PROMETHAZINE HCL 25 MG/ML IJ SOLN: 6.2500 mg | INTRAMUSCULAR | Status: DC | PRN

## 2019-01-09 MED ORDER — LIDOCAINE 2% (20 MG/ML) 5 ML SYRINGE
INTRAMUSCULAR | Status: AC
  Filled 2019-01-09: qty 5

## 2019-01-09 MED ORDER — BUPIVACAINE LIPOSOME 1.3 % IJ SUSP
INTRAMUSCULAR | Status: DC | PRN
  Administered 2019-01-09: 20 mL

## 2019-01-09 MED ORDER — SUCCINYLCHOLINE CHLORIDE 200 MG/10ML IV SOSY
PREFILLED_SYRINGE | INTRAVENOUS | Status: AC
  Filled 2019-01-09: qty 10

## 2019-01-09 MED ORDER — SODIUM CHLORIDE (PF) 0.9 % IJ SOLN
INTRAMUSCULAR | Status: DC | PRN
  Administered 2019-01-09: 10 mL

## 2019-01-09 MED ORDER — DEXAMETHASONE SODIUM PHOSPHATE 10 MG/ML IJ SOLN
INTRAMUSCULAR | Status: DC | PRN
  Administered 2019-01-09: 8 mg via INTRAVENOUS

## 2019-01-09 MED ORDER — MORPHINE SULFATE (PF) 2 MG/ML IV SOLN
1.0000 mg | INTRAVENOUS | Status: DC | PRN
  Administered 2019-01-09: 2 mg via INTRAVENOUS
  Filled 2019-01-09: qty 1

## 2019-01-09 MED ORDER — APREPITANT 40 MG PO CAPS
40.0000 mg | ORAL_CAPSULE | ORAL | Status: AC
  Administered 2019-01-09: 40 mg via ORAL
  Filled 2019-01-09: qty 1

## 2019-01-09 MED ORDER — ENSURE MAX PROTEIN PO LIQD
2.0000 [oz_av] | ORAL | Status: DC
  Administered 2019-01-10 (×3): 2 [oz_av] via ORAL

## 2019-01-09 MED ORDER — MIDAZOLAM HCL 2 MG/2ML IJ SOLN
INTRAMUSCULAR | Status: AC
  Filled 2019-01-09: qty 2

## 2019-01-09 MED ORDER — KETAMINE HCL 10 MG/ML IJ SOLN
INTRAMUSCULAR | Status: DC | PRN
  Administered 2019-01-09 (×2): 25 mg via INTRAVENOUS

## 2019-01-09 MED ORDER — SUGAMMADEX SODIUM 200 MG/2ML IV SOLN
INTRAVENOUS | Status: DC | PRN
  Administered 2019-01-09: 240 mg via INTRAVENOUS

## 2019-01-09 MED ORDER — ROCURONIUM BROMIDE 10 MG/ML (PF) SYRINGE
PREFILLED_SYRINGE | INTRAVENOUS | Status: AC
  Filled 2019-01-09: qty 10

## 2019-01-09 MED ORDER — CHLORHEXIDINE GLUCONATE CLOTH 2 % EX PADS: 6.0000 | MEDICATED_PAD | Freq: Once | CUTANEOUS | Status: DC

## 2019-01-09 MED ORDER — SODIUM CHLORIDE (PF) 0.9 % IJ SOLN
INTRAMUSCULAR | Status: AC
  Filled 2019-01-09: qty 10

## 2019-01-09 MED ORDER — METOPROLOL TARTRATE 5 MG/5ML IV SOLN: 5.0000 mg | Freq: Four times a day (QID) | INTRAVENOUS | Status: DC | PRN

## 2019-01-09 MED ORDER — FENTANYL CITRATE (PF) 100 MCG/2ML IJ SOLN
25.0000 ug | INTRAMUSCULAR | Status: DC | PRN
  Administered 2019-01-09: 50 ug via INTRAVENOUS

## 2019-01-09 MED ORDER — ACETAMINOPHEN 160 MG/5ML PO SOLN
1000.0000 mg | Freq: Three times a day (TID) | ORAL | Status: DC
  Administered 2019-01-09 – 2019-01-10 (×2): 1000 mg via ORAL
  Filled 2019-01-09 (×2): qty 40.6

## 2019-01-09 MED ORDER — PROPOFOL 10 MG/ML IV BOLUS
INTRAVENOUS | Status: DC | PRN
  Administered 2019-01-09: 200 mg via INTRAVENOUS

## 2019-01-09 MED ORDER — ACETAMINOPHEN 500 MG PO TABS: 1000.0000 mg | ORAL_TABLET | Freq: Three times a day (TID) | ORAL | Status: DC

## 2019-01-09 MED ORDER — ONDANSETRON HCL 4 MG/2ML IJ SOLN
INTRAMUSCULAR | Status: DC | PRN
  Administered 2019-01-09: 4 mg via INTRAVENOUS

## 2019-01-09 MED ORDER — ROCURONIUM BROMIDE 10 MG/ML (PF) SYRINGE
PREFILLED_SYRINGE | INTRAVENOUS | Status: DC | PRN
  Administered 2019-01-09: 50 mg via INTRAVENOUS

## 2019-01-09 MED ORDER — KETAMINE HCL 10 MG/ML IJ SOLN
INTRAMUSCULAR | Status: AC
  Filled 2019-01-09: qty 1

## 2019-01-09 MED ORDER — LACTATED RINGERS IV SOLN
INTRAVENOUS | Status: DC
  Administered 2019-01-09: 06:00:00 via INTRAVENOUS

## 2019-01-09 MED ORDER — DEXAMETHASONE SODIUM PHOSPHATE 10 MG/ML IJ SOLN
INTRAMUSCULAR | Status: AC
  Filled 2019-01-09: qty 1

## 2019-01-09 MED ORDER — ONDANSETRON HCL 4 MG/2ML IJ SOLN
INTRAMUSCULAR | Status: AC
  Filled 2019-01-09: qty 2

## 2019-01-09 MED ORDER — 0.9 % SODIUM CHLORIDE (POUR BTL) OPTIME
TOPICAL | Status: DC | PRN
  Administered 2019-01-09: 08:00:00 1000 mL

## 2019-01-09 MED ORDER — KETOROLAC TROMETHAMINE 30 MG/ML IJ SOLN: 30.0000 mg | Freq: Once | INTRAMUSCULAR | Status: DC | PRN

## 2019-01-09 MED ORDER — SCOPOLAMINE 1 MG/3DAYS TD PT72
1.0000 | MEDICATED_PATCH | TRANSDERMAL | Status: DC
  Administered 2019-01-09: 1.5 mg via TRANSDERMAL
  Filled 2019-01-09: qty 1

## 2019-01-09 MED ORDER — SODIUM CHLORIDE 0.9 % IV SOLN
2.0000 g | INTRAVENOUS | Status: AC
  Administered 2019-01-09: 2 g via INTRAVENOUS
  Filled 2019-01-09: qty 2

## 2019-01-09 MED ORDER — LIDOCAINE 2% (20 MG/ML) 5 ML SYRINGE
INTRAMUSCULAR | Status: DC | PRN
  Administered 2019-01-09: 1.5 mg/kg/h via INTRAVENOUS

## 2019-01-09 MED ORDER — PROPOFOL 10 MG/ML IV BOLUS
INTRAVENOUS | Status: AC
  Filled 2019-01-09: qty 20

## 2019-01-09 MED ORDER — HEPARIN SODIUM (PORCINE) 5000 UNIT/ML IJ SOLN
5000.0000 [IU] | INTRAMUSCULAR | Status: AC
  Administered 2019-01-09: 5000 [IU] via SUBCUTANEOUS
  Filled 2019-01-09: qty 1

## 2019-01-09 MED ORDER — SUCCINYLCHOLINE CHLORIDE 200 MG/10ML IV SOSY
PREFILLED_SYRINGE | INTRAVENOUS | Status: DC | PRN
  Administered 2019-01-09: 140 mg via INTRAVENOUS

## 2019-01-09 MED ORDER — HEPARIN SODIUM (PORCINE) 5000 UNIT/ML IJ SOLN
5000.0000 [IU] | Freq: Three times a day (TID) | INTRAMUSCULAR | Status: DC
  Administered 2019-01-09 – 2019-01-10 (×3): 5000 [IU] via SUBCUTANEOUS
  Filled 2019-01-09 (×3): qty 1

## 2019-01-09 MED ORDER — FENTANYL CITRATE (PF) 250 MCG/5ML IJ SOLN
INTRAMUSCULAR | Status: AC
  Filled 2019-01-09: qty 5

## 2019-01-09 MED ORDER — ONDANSETRON HCL 4 MG/2ML IJ SOLN: 4.0000 mg | INTRAMUSCULAR | Status: DC | PRN

## 2019-01-09 MED ORDER — METOPROLOL SUCCINATE ER 25 MG PO TB24
25.0000 mg | ORAL_TABLET | Freq: Every day | ORAL | Status: DC
  Administered 2019-01-10: 09:00:00 25 mg via ORAL
  Filled 2019-01-09: qty 1

## 2019-01-09 MED ORDER — OXYCODONE HCL 5 MG/5ML PO SOLN: 5.0000 mg | Freq: Four times a day (QID) | ORAL | Status: DC | PRN

## 2019-01-09 SURGICAL SUPPLY — 65 items
APPLICATOR COTTON TIP 6 STRL (MISCELLANEOUS) IMPLANT
APPLICATOR COTTON TIP 6IN STRL (MISCELLANEOUS)
APPLIER CLIP 5 13 M/L LIGAMAX5 (MISCELLANEOUS)
APPLIER CLIP ROT 10 11.4 M/L (STAPLE)
APPLIER CLIP ROT 13.4 12 LRG (CLIP)
BLADE SURG 15 STRL LF DISP TIS (BLADE) ×1 IMPLANT
BLADE SURG 15 STRL SS (BLADE) ×2
CABLE HIGH FREQUENCY MONO STRZ (ELECTRODE) ×3 IMPLANT
CLIP APPLIE 5 13 M/L LIGAMAX5 (MISCELLANEOUS) IMPLANT
CLIP APPLIE ROT 10 11.4 M/L (STAPLE) IMPLANT
CLIP APPLIE ROT 13.4 12 LRG (CLIP) IMPLANT
COVER WAND RF STERILE (DRAPES) IMPLANT
DERMABOND ADVANCED (GAUZE/BANDAGES/DRESSINGS) ×2
DERMABOND ADVANCED .7 DNX12 (GAUZE/BANDAGES/DRESSINGS) IMPLANT
DEVICE SUT QUICK LOAD TK 5 (STAPLE) IMPLANT
DEVICE SUT TI-KNOT TK 5X26 (MISCELLANEOUS) IMPLANT
DEVICE SUTURE ENDOST 10MM (ENDOMECHANICALS) IMPLANT
DEVICE TI KNOT TK5 (MISCELLANEOUS)
DISSECTOR BLUNT TIP ENDO 5MM (MISCELLANEOUS) IMPLANT
ELECT REM PT RETURN 15FT ADLT (MISCELLANEOUS) ×3 IMPLANT
GAUZE SPONGE 2X2 8PLY STRL LF (GAUZE/BANDAGES/DRESSINGS) IMPLANT
GAUZE SPONGE 4X4 12PLY STRL (GAUZE/BANDAGES/DRESSINGS) IMPLANT
GLOVE BIOGEL M 8.0 STRL (GLOVE) ×3 IMPLANT
GOWN STRL REUS W/TWL XL LVL3 (GOWN DISPOSABLE) ×12 IMPLANT
GRASPER SUT TROCAR 14GX15 (MISCELLANEOUS) ×3 IMPLANT
HANDLE STAPLE EGIA 4 XL (STAPLE) ×3 IMPLANT
HOVERMATT SINGLE USE (MISCELLANEOUS) ×3 IMPLANT
KIT BASIN OR (CUSTOM PROCEDURE TRAY) ×3 IMPLANT
KIT TURNOVER KIT A (KITS) IMPLANT
MARKER SKIN DUAL TIP RULER LAB (MISCELLANEOUS) ×3 IMPLANT
NDL SPNL 22GX3.5 QUINCKE BK (NEEDLE) ×1 IMPLANT
NEEDLE SPNL 22GX3.5 QUINCKE BK (NEEDLE) ×3 IMPLANT
PACK UNIVERSAL I (CUSTOM PROCEDURE TRAY) ×3 IMPLANT
QUICK LOAD TK 5 (STAPLE)
RELOAD STAPLE 45 PURP MED/THCK (STAPLE) IMPLANT
RELOAD TRI 45 ART MED THCK BLK (STAPLE) ×3 IMPLANT
RELOAD TRI 45 ART MED THCK PUR (STAPLE) ×3 IMPLANT
RELOAD TRI 60 ART MED THCK BLK (STAPLE) ×3 IMPLANT
RELOAD TRI 60 ART MED THCK PUR (STAPLE) ×6 IMPLANT
SCISSORS LAP 5X45 EPIX DISP (ENDOMECHANICALS) IMPLANT
SET IRRIG TUBING LAPAROSCOPIC (IRRIGATION / IRRIGATOR) ×3 IMPLANT
SET TUBE SMOKE EVAC HIGH FLOW (TUBING) ×3 IMPLANT
SHEARS HARMONIC ACE PLUS 45CM (MISCELLANEOUS) ×3 IMPLANT
SLEEVE ADV FIXATION 5X100MM (TROCAR) ×6 IMPLANT
SLEEVE GASTRECTOMY 36FR VISIGI (MISCELLANEOUS) ×3 IMPLANT
SOLUTION ANTI FOG 6CC (MISCELLANEOUS) ×3 IMPLANT
SPONGE GAUZE 2X2 STER 10/PKG (GAUZE/BANDAGES/DRESSINGS) ×2
SPONGE LAP 18X18 RF (DISPOSABLE) ×3 IMPLANT
STAPLER VISISTAT 35W (STAPLE) ×3 IMPLANT
SUT MNCRL AB 4-0 PS2 18 (SUTURE) ×6 IMPLANT
SUT SURGIDAC NAB ES-9 0 48 120 (SUTURE) IMPLANT
SUT VICRYL 0 TIES 12 18 (SUTURE) ×3 IMPLANT
SYR 10ML ECCENTRIC (SYRINGE) ×3 IMPLANT
SYR 20ML LL LF (SYRINGE) ×3 IMPLANT
SYR 50ML LL SCALE MARK (SYRINGE) ×3 IMPLANT
TAPE CLOTH SURG 4X10 WHT LF (GAUZE/BANDAGES/DRESSINGS) ×2 IMPLANT
TOWEL OR 17X26 10 PK STRL BLUE (TOWEL DISPOSABLE) ×6 IMPLANT
TOWEL OR NON WOVEN STRL DISP B (DISPOSABLE) ×3 IMPLANT
TROCAR ADV FIXATION 5X100MM (TROCAR) ×3 IMPLANT
TROCAR BLADELESS 15MM (ENDOMECHANICALS) ×3 IMPLANT
TROCAR BLADELESS OPT 5 100 (ENDOMECHANICALS) ×3 IMPLANT
TUBE CALIBRATION LAPBAND (TUBING) IMPLANT
TUBING CONNECTING 10 (TUBING) ×2 IMPLANT
TUBING CONNECTING 10' (TUBING) ×1
TUBING ENDO SMARTCAP (MISCELLANEOUS) ×3 IMPLANT

## 2019-01-09 NOTE — Op Note (Signed)
09 January 2019  Surgeon: Kaylyn Lim, MD, FACS  Asst:  Romana Juniper, MD, FACS  Anes:  General endotracheal  Procedure: Laparoscopic sleeve gastrectomy and upper endoscopy  Diagnosis: Morbid obesity  Complications: none  EBL:   minimal cc  Description of Procedure:  The patient was take to OR 1 and given general anesthesia.  The abdomen was prepped with Technicare and draped sterilely. Multiple punctate skin abrasions noted.  A timeout was performed.  Access to the abdomen was achieved with a 5 mm Optiview through the left upper quadrant.  Following insufflation, the state of the abdomen was found to be free of adhesions.  The ViSiGi 36Fr tube was inserted to deflate the stomach and was pulled back into the esophagus.    The pylorus was identified and we measured 5 cm back and marked the antrum.  At that point we began dissection to take down the greater curvature of the stomach using the Harmonic scalpel.  This dissection was taken all the way up to the left crus.  Posterior attachments of the stomach were also taken down.    The ViSiGi tube was then passed into the antrum and suction applied so that it was snug along the lessor curvature.  The "crow's foot" or incisura was identified.  The sleeve gastrectomy was begun using the [employer] stapler beginning with a 4.5 cm black load with TRS followed by a 6 cm black with TRS and then purple loads with TRS.  When the sleeve was complete the tube was taken off suction and insufflated briefly.  The tube was withdrawn.  Upper endoscopy was then performed by Dr. Kae Heller.     The specimen was extracted through the 15 trocar site.  This was closed with a 0 vicryl.   Local was provided by infiltrating with 30 cc Exparel with a TAP block and closed 4-0 Monocryl and staples.    Matt B. Hassell Done, [place], [facility], [place]

## 2019-01-09 NOTE — Anesthesia Procedure Notes (Signed)
Procedure Name: Intubation Date/Time: 01/09/2019 7:39 AM Performed by: Victoriano Lain, CRNA Pre-anesthesia Checklist: Patient identified, Emergency Drugs available, Suction available, Patient being monitored and Timeout performed Patient Re-evaluated:Patient Re-evaluated prior to induction Oxygen Delivery Method: Circle system utilized Preoxygenation: Pre-oxygenation with 100% oxygen Induction Type: IV induction, Rapid sequence and Cricoid Pressure applied Laryngoscope Size: Mac Grade View: Grade I Tube type: Oral Tube size: 7.5 mm Number of attempts: 1 Airway Equipment and Method: Stylet Placement Confirmation: ETT inserted through vocal cords under direct vision,  positive ETCO2 and breath sounds checked- equal and bilateral Secured at: 21 cm Tube secured with: Tape Dental Injury: Teeth and Oropharynx as per pre-operative assessment

## 2019-01-09 NOTE — Interval H&P Note (Signed)
History and Physical Interval Note:  01/09/2019 7:25 AM  Barbara Buckley  has presented today for surgery, with the diagnosis of Morbid Obesity, HTN, OSA.  The various methods of treatment have been discussed with the patient and family. After consideration of risks, benefits and other options for treatment, the patient has consented to  Procedure(s): LAPAROSCOPIC GASTRIC SLEEVE RESECTION, UPPER ENDO, ERAS Pathway (N/A) as a surgical intervention.  The patient's history has been reviewed, patient examined, no change in status, stable for surgery.  I have reviewed the patient's chart and labs.  Questions were answered to the patient's satisfaction.     [name]

## 2019-01-09 NOTE — Transfer of Care (Signed)
Immediate Anesthesia Transfer of Care Note  Patient: Barbara Buckley  Procedure(s) Performed: LAPAROSCOPIC GASTRIC SLEEVE RESECTION, UPPER ENDO (N/A Abdomen)  Patient Location: PACU  Anesthesia Type:General  Level of Consciousness: awake, alert , oriented and patient cooperative  Airway & Oxygen Therapy: Patient Spontanous Breathing and Patient connected to face mask oxygen  Post-op Assessment: Report given to RN, Post -op Vital signs reviewed and stable and Patient moving all extremities  Post vital signs: Reviewed and stable  Last Vitals:  Vitals Value Taken Time  BP 149/89 01/09/19 0920  Temp    Pulse 85 01/09/19 0922  Resp 18 01/09/19 0921  SpO2 98 % 01/09/19 0922  Vitals shown include unvalidated device data.  Last Pain:  Vitals:   01/09/19 0557  TempSrc: Oral  PainSc: 0-No pain      Patients Stated Pain Goal: 3 (11/65/79 0383)  Complications: No apparent anesthesia complications

## 2019-01-09 NOTE — Op Note (Signed)
Preoperative diagnosis: laparoscopic sleeve gastrectomy  Postoperative diagnosis: Same   Procedure: Upper endoscopy   Surgeon: Clovis Riley, M.D.  Anesthesia: Gen.   Description of procedure: The endoscopy was placed in the mouth and into the oropharynx and under endoscopic vision it was advanced to the esophagogastric junction which was identified at 35cm from the teeth.  The pouch was tensely insufflated while the upper abdomen was flooded with irrigation to perform a leak test which was negative- no bubbles were seen.  The staple line is hemostatic. The lumen is evenly tubular without any undue narrowing, angulation or twisting specifically at the incisura. The stomach was decompressed and the scope was withdrawn without difficulty.    Clovis Riley, M.D. General, Bariatric, & Minimally Invasive Surgery [facility]

## 2019-01-09 NOTE — Progress Notes (Signed)
Discussed post op day goals with patient including ambulation, IS, diet progression, pain, and nausea control.  BSTOP education provided including BSTOP information guide, "Guide for Pain Management after your Bariatric Procedure".  Questions answered. 

## 2019-01-09 NOTE — Progress Notes (Signed)
Attempted to ambulate pt. Pt stated right now she feels too sleepy. Will attempt later.

## 2019-01-09 NOTE — Progress Notes (Signed)
Pt started drinking her first 2oz cup of water at 1400.

## 2019-01-09 NOTE — Progress Notes (Signed)
PHARMACY CONSULT FOR:  Risk Assessment for Post-Discharge VTE Following Bariatric Surgery  Post-Discharge VTE Risk Assessment: This patient's probability of 30-day post-discharge VTE is increased due to the factors marked:   Female    Age >/=60 years    BMI >/=50 kg/m2    CHF    Dyspnea at Rest    Paraplegia  X  Non-gastric-band surgery    Operation Time >/=3 hr    Return to OR     Length of Stay >/= 3 d   Predicted probability of 30-day post-discharge VTE: 0.16%   Recommendation for Discharge: . No pharmacologic prophylaxis post-discharge . Enoxaparin 40 mg [place] q12h x 2 weeks post-discharge . Enoxaparin 40 mg [place] q12h x 4 weeks post-discharge . Enoxaparin 60 mg [place] q12h x 2 weeks post-discharge . Enoxaparin 60 mg [place] q12h x 4 weeks post-discharge . Case manager consulted to review insurance coverage and provide estimated patient cost . Follow daily and recalculate estimated 30d VTE risk if returns to OR or LOS reaches 3 days.   Barbara Buckley is a 58 y.o. female who underwent  sleeve gastrectomy  on 01/09/19.   Case start: 0801 Case end: 0913   Allergies  Allergen Reactions  . Lisinopril Cough    Patient Measurements: Height: 5\' 6"  (167.6 cm) Weight: 252 lb 9.6 oz (114.6 kg) IBW/kg (Calculated) : 59.3 Body mass index is 40.77 kg/m.  Recent Labs    01/09/19 1126  WBC 7.6  HGB 13.1  HCT 41.5  PLT 190  CREATININE 0.88   Estimated Creatinine Clearance: 90.6 mL/min (by C-G formula based on SCr of 0.88 mg/dL).  Past Medical History:  Diagnosis Date  . Anxiety    at night  . CKD (chronic kidney disease), stage III (HCC)   . GERD (gastroesophageal reflux disease)    history of  . History of cold sores   . Hypertension   . Lymphedema    Bilateral lower extremity  . Migraines    history of  . Obesity   . OSA on CPAP   . PONV (postoperative nausea and vomiting)   . Restless leg syndrome   . Skin rash    due to stress  . Tachycardia   . Vitamin D  deficiency     Medications Prior to Admission  Medication Sig Dispense Refill Last Dose  . acetaminophen (TYLENOL) 325 MG tablet Take 650 mg by mouth every 6 (six) hours as needed for moderate pain or headache.   Past Month at Unknown time  . Brimonidine Tartrate (LUMIFY) 0.025 % SOLN Place 1 drop into both eyes daily.   Past Month at Unknown time  . losartan (COZAAR) 100 MG tablet Take 100 mg by mouth daily.   01/08/2019 at Unknown time  . metoprolol succinate (TOPROL-XL) 25 MG 24 hr tablet Take 25 mg by mouth daily.   01/09/2019 at 0430  . Multiple Vitamins-Minerals (BARIATRIC MULTIVITAMINS/IRON PO) Take 1 tablet by mouth daily.   2 weeks ago  . Ascorbic Acid (VITAMIN C) 1000 MG tablet Take 2,000 mg by mouth daily.   2 weeks ago  . ASTAXANTHIN PO Take 12 mg by mouth daily.   2 weeks ago  . b complex vitamins tablet Take 1 tablet by mouth daily.   2 weeks ago  . Flaxseed, Linseed, (FLAX SEED OIL PO) Take 1,200 mg by mouth daily.   2 weeks ago  . GLUCOSAMINE-CHONDROITIN PO Take 1 tablet by mouth daily.   2 weeks ago  .  Methylsulfonylmethane (MSM) 1000 MG CAPS Take 3,000 mg by mouth daily.    2 weeks ago  . Turmeric 500 MG CAPS Take 500 mg by mouth daily.   2 weeks ago  . zinc gluconate 50 MG tablet Take 50 mg by mouth daily.   2 weeks ago    Dia Sitter, PharmD, BCPS 01/09/2019 1:55 PM

## 2019-01-09 NOTE — Discharge Instructions (Signed)
° ° ° °GASTRIC BYPASS/SLEEVE ° Home Care Instructions ° ° These instructions are to help you care for yourself when you go home. ° °Call: If you have any problems. °• Call [phone] and ask for the surgeon on call °• If you need immediate help, come to the ER at [place].  °• Tell the ER staff that you are a new post-op gastric bypass or gastric sleeve patient °  °Signs and symptoms to report: • Severe vomiting or nausea °o If you cannot keep down clear liquids for longer than 1 day, call your surgeon  °• Abdominal pain that does not get better after taking your pain medication °• Fever over 100.4° F with chills °• Heart beating over 100 beats a minute °• Shortness of breath at rest °• Chest pain °•  Redness, swelling, drainage, or foul odor at incision (surgical) sites °•  If your incisions open or pull apart °• Swelling or pain in calf (lower leg) °• Diarrhea (Loose bowel movements that happen often), frequent watery, uncontrolled bowel movements °• Constipation, (no bowel movements for 3 days) if this happens: Pick one °o Milk of Magnesia, 2 tablespoons by mouth, 3 times a day for 2 days if needed °o Stop taking Milk of Magnesia once you have a bowel movement °o Call your doctor if constipation continues °Or °o Miralax  (instead of Milk of Magnesia) following the label instructions °o Stop taking Miralax once you have a bowel movement °o Call your doctor if constipation continues °• Anything you think is not normal °  °Normal side effects after surgery: • Unable to sleep at night or unable to focus °• Irritability or moody °• Being tearful (crying) or depressed °These are common complaints, possibly related to your anesthesia medications that put you to sleep, stress of surgery, and change in lifestyle.  This usually goes away a few weeks after surgery.  If these feelings continue, call your primary care doctor. °  °Wound Care: You may have surgical glue, steri-strips, or staples over your incisions after  surgery °• Surgical glue:  Looks like a clear film over your incisions and will wear off a little at a time °• Steri-strips: Strips of tape over your incisions. You may notice a yellowish color on the skin under the steri-strips. This is used to make the   steri-strips stick better. Do not pull the steri-strips off - let them fall off °• Staples: Staples may be removed before you leave the hospital °o If you go home with staples, call [facility], [phone] at for an appointment with your surgeon’s nurse to have staples removed 10 days after surgery. °• Showering: You may shower two (2) days after your surgery unless your surgeon tells you differently °o Wash gently around incisions with warm soapy water, rinse well, and gently pat dry  °o No tub baths until staples are removed, steri-strips fall off or glue is gone.  °  °Medications: • Medications should be liquid or crushed if larger than the size of a dime °• Extended release pills (medication that release a little bit at a time through the day) should NOT be crushed or cut. (examples include XL, ER, DR, SR) °• Depending on the size and number of medications you take, you may need to space (take a few throughout the day)/change the time you take your medications so that you do not over-fill your pouch (smaller stomach) °• Make sure you follow-up with your primary care doctor to   make medication changes needed during rapid weight loss and life-style changes °• If you have diabetes, follow up with the doctor that orders your diabetes medication(s) within one week after surgery and check your blood sugar regularly. °• Do not drive while taking prescription pain medication  °• It is ok to take Tylenol by the bottle instructions with your pain medicine or instead of your pain medicine as needed.  DO NOT TAKE NSAIDS (EXAMPLES OF NSAIDS:  IBUPROFREN/ NAPROXEN)  °Diet:                    First 2 Weeks ° You will see the dietician t about two (2) weeks  after your surgery. The dietician will increase the types of foods you can eat if you are handling liquids well: °• If you have severe vomiting or nausea and cannot keep down clear liquids lasting longer than 1 day, call your surgeon @ ([phone]) °Protein Shake °• Drink at least 2 ounces of shake 5-6 times per day °• Each serving of protein shakes (usually 8 - 12 ounces) should have: °o 15 grams of protein  °o And no more than 5 grams of carbohydrate  °• Goal for protein each day: °o Men = 80 grams per day °o Women = 60 grams per day °• Protein powder may be added to fluids such as non-fat milk or Lactaid milk or unsweetened Soy/Almond milk (limit to 35 grams added protein powder per serving) ° °Hydration °• Slowly increase the amount of water and other clear liquids as tolerated (See Acceptable Fluids) °• Slowly increase the amount of protein shake as tolerated  °•  Sip fluids slowly and throughout the day.  Do not use straws. °• May use sugar substitutes in small amounts (no more than 6 - 8 packets per day; i.e. Splenda) ° °Fluid Goal °• The first goal is to drink at least 8 ounces of protein shake/drink per day (or as directed by the nutritionist); some examples of protein shakes are Syntrax Nectar, Adkins Advantage, EAS Edge HP, and Unjury. See handout from pre-op Bariatric Education Class: °o Slowly increase the amount of protein shake you drink as tolerated °o You may find it easier to slowly sip shakes throughout the day °o It is important to get your proteins in first °• Your fluid goal is to drink 64 - 100 ounces of fluid daily °o It may take a few weeks to build up to this °• 32 oz (or more) should be clear liquids  °And  °• 32 oz (or more) should be full liquids (see below for examples) °• Liquids should not contain sugar, caffeine, or carbonation ° °Clear Liquids: °• Water or Sugar-free flavored water (i.e. Fruit H2O, Propel) °• Decaffeinated coffee or tea (sugar-free) °• Crystal Lite, Wyler’s Lite,  Minute Maid Lite °• Sugar-free Jell-O °• Bouillon or broth °• Sugar-free Popsicle:   *Less than 20 calories each; Limit 1 per day ° °Full Liquids: °Protein Shakes/Drinks + 2 choices per day of other full liquids °• Full liquids must be: °o No More Than 15 grams of Carbs per serving  °o No More Than 3 grams of Fat per serving °• Strained low-fat cream soup (except Cream of Potato or Tomato) °• Non-Fat milk °• Fat-free Lactaid Milk °• Unsweetened Soy Or Unsweetened Almond Milk °• Low Sugar yogurt (Dannon Lite & Fit, Greek yogurt; Oikos Triple Zero; Chobani Simply 100; Yoplait 100 calorie Greek - No Fruit on the Bottom) ° °  °Vitamins   and Minerals • Start 1 day after surgery unless otherwise directed by your surgeon °• 2 Chewable Bariatric Specific Multivitamin / Multimineral Supplement with iron (Example: Bariatric Advantage Multi EA) °• Chewable Calcium with Vitamin D-3 °(Example: 3 Chewable Calcium Plus 600 with Vitamin D-3) °o Take 500 mg three (3) times a day for a total of 1500 mg each day °o Do not take all 3 doses of calcium at one time as it may cause constipation, and you can only absorb 500 mg  at a time  °o Do not mix multivitamins containing iron with calcium supplements; take 2 hours apart °• Menstruating women and those with a history of anemia (a blood disease that causes weakness) may need extra iron °o Talk with your doctor to see if you need more iron °• Do not stop taking or change any vitamins or minerals until you talk to your dietitian or surgeon °• Your Dietitian and/or surgeon must approve all vitamin and mineral supplements °  °Activity and Exercise: Limit your physical activity as instructed by your doctor.  It is important to continue walking at home.  During this time, use these guidelines: °• Do not lift anything greater than ten (10) pounds for at least two (2) weeks °• Do not go back to work or drive until your surgeon says you can °• You may have sex when you feel comfortable  °o It is  VERY important for female patients to use a reliable birth control method; fertility often increases after surgery  °o All hormonal birth control will be ineffective for 30 days after surgery due to medications given during surgery a barrier method must be used. °o Do not get pregnant for at least 18 months °• Start exercising as soon as your doctor tells you that you can °o Make sure your doctor approves any physical activity °• Start with a simple walking program °• Walk 5-15 minutes each day, 7 days per week.  °• Slowly increase until you are walking 30-45 minutes per day °Consider joining our BELT program. [phone] or email belt@[email or web address] °  °Special Instructions Things to remember: °• Use your CPAP when sleeping if this applies to you ° °• [facility] has two free Bariatric Surgery Support Groups that meet monthly °o The 3rd Thursday of each month, 6 pm, [facility]  °o The 2nd Friday of each month, 11:45 am in the private dining room in the basement of [place] °• It is very important to keep all follow up appointments with your surgeon, dietitian, primary care physician, and behavioral health practitioner °• Routine follow up schedule with your surgeon include appointments at 2-3 weeks, 6-8 weeks, 6 months, and 1 year at a minimum.  Your surgeon may request to see you more often.   °o After the first year, please follow up with your bariatric surgeon and dietitian at least once a year in order to maintain best weight loss results °[facility]: [phone] °[facility]: [phone] °Bariatric Nurse Coordinator: [phone] °  °   Reviewed and Endorsed  °by [facility] Patient Education Committee, June, 2016 °Edits Approved: Aug, 2018 ° ° ° °

## 2019-01-09 NOTE — Anesthesia Postprocedure Evaluation (Signed)
Anesthesia Post Note  Patient: Barbara Buckley  Procedure(s) Performed: LAPAROSCOPIC GASTRIC SLEEVE RESECTION, UPPER ENDO (N/A Abdomen)     Patient location during evaluation: PACU Anesthesia Type: General Level of consciousness: awake and alert Pain management: pain level controlled Vital Signs Assessment: post-procedure vital signs reviewed and stable Respiratory status: spontaneous breathing, nonlabored ventilation, respiratory function stable and patient connected to nasal cannula oxygen Cardiovascular status: blood pressure returned to baseline and stable Postop Assessment: no apparent nausea or vomiting Anesthetic complications: no    Last Vitals:  Vitals:   01/09/19 1308 01/09/19 1400  BP: (!) 161/87 (!) 146/85  Pulse: 99 79  Resp: 16 16  Temp: 36.6 C 36.7 C  SpO2: 100% 99%    Last Pain:  Vitals:   01/09/19 1400  TempSrc: Oral  PainSc:                  Nyriah Coote S

## 2019-01-10 ENCOUNTER — Encounter (HOSPITAL_COMMUNITY): Payer: Self-pay | Admitting: Surgery

## 2019-01-10 LAB — CBC WITH DIFFERENTIAL/PLATELET
Abs Immature Granulocytes: 0.06 10*3/uL (ref 0.00–0.07)
Basophils Absolute: 0 10*3/uL (ref 0.0–0.1)
Basophils Relative: 0 %
Eosinophils Absolute: 0 10*3/uL (ref 0.0–0.5)
Eosinophils Relative: 0 %
HCT: 38.6 % (ref 36.0–46.0)
Hemoglobin: 12.5 g/dL (ref 12.0–15.0)
Immature Granulocytes: 1 %
Lymphocytes Relative: 7 %
Lymphs Abs: 0.7 10*3/uL (ref 0.7–4.0)
MCH: 28.8 pg (ref 26.0–34.0)
MCHC: 32.4 g/dL (ref 30.0–36.0)
MCV: 88.9 fL (ref 80.0–100.0)
Monocytes Absolute: 0.4 10*3/uL (ref 0.1–1.0)
Monocytes Relative: 4 %
Neutro Abs: 7.9 10*3/uL — ABNORMAL HIGH (ref 1.7–7.7)
Neutrophils Relative %: 88 %
Platelets: 206 10*3/uL (ref 150–400)
RBC: 4.34 MIL/uL (ref 3.87–5.11)
RDW: 14.2 % (ref 11.5–15.5)
WBC: 9 10*3/uL (ref 4.0–10.5)
nRBC: 0 % (ref 0.0–0.2)

## 2019-01-10 MED ORDER — GABAPENTIN 100 MG PO CAPS: 200.0000 mg | ORAL_CAPSULE | Freq: Two times a day (BID) | ORAL | 0 refills | Status: DC

## 2019-01-10 MED ORDER — ONDANSETRON 4 MG PO TBDP: 4.0000 mg | ORAL_TABLET | Freq: Four times a day (QID) | ORAL | 0 refills | Status: DC | PRN

## 2019-01-10 MED ORDER — TRAMADOL HCL 50 MG PO TABS: 50.0000 mg | ORAL_TABLET | Freq: Four times a day (QID) | ORAL | 0 refills | Status: DC | PRN

## 2019-01-10 MED ORDER — PANTOPRAZOLE SODIUM 40 MG PO TBEC: 40.0000 mg | DELAYED_RELEASE_TABLET | Freq: Every day | ORAL | 0 refills | Status: DC

## 2019-01-10 NOTE — Discharge Summary (Signed)
Physician Discharge Summary  Patient ID: Barbara Buckley MRN: [id number] DOB/AGE: [birth year] 57 y.o.  PCP: Ward, Elenora Fenderhelsea C, MD  Admit date: 01/09/2019 Discharge date: 01/10/2019  Admission Diagnoses:  obesity  Discharge Diagnoses:  same  Principal Problem:   S/P laparoscopic sleeve gastrectomyAugust 2020 Active Problems:   SVT (supraventricular tachycardia) (HCC)   OSA (obstructive sleep apnea)   Essential hypertension   Surgery:  Lap sleeve gastrectomy  Discharged Condition: improved  Hospital Course:   Had surgery on Monday.  Started on liquids and protein shakes which she tolerated.  Ready for discharge on Tuesday.   Consults: none  Significant Diagnostic Studies: none    Discharge Exam: Blood pressure 119/73, pulse 88, temperature 98.1 F (36.7 C), temperature source Oral, resp. rate 16, height 5\' 6"  (1.676 m), weight 114.6 kg, SpO2 98 %. Incisions OK  Disposition: Discharge disposition: 01-Home or Self Care       Discharge Instructions    Ambulate hourly while awake   Complete by: As directed    Call MD for:  difficulty breathing, headache or visual disturbances   Complete by: As directed    Call MD for:  persistant dizziness or light-headedness   Complete by: As directed    Call MD for:  persistant nausea and vomiting   Complete by: As directed    Call MD for:  redness, tenderness, or signs of infection (pain, swelling, redness, odor or green/yellow discharge around incision site)   Complete by: As directed    Call MD for:  severe uncontrolled pain   Complete by: As directed    Call MD for:  temperature >101 F   Complete by: As directed    Diet bariatric full liquid   Complete by: As directed    Incentive spirometry   Complete by: As directed    Perform hourly while awake     Allergies as of 01/10/2019      Reactions   Lisinopril Cough      Medication List    TAKE these medications   acetaminophen 325 MG tablet Commonly known as:  TYLENOL Take 650 mg by mouth every 6 (six) hours as needed for moderate pain or headache.   ASTAXANTHIN PO Take 12 mg by mouth daily.   b complex vitamins tablet Take 1 tablet by mouth daily.   BARIATRIC MULTIVITAMINS/IRON PO Take 1 tablet by mouth daily.   FLAX SEED OIL PO Take 1,200 mg by mouth daily.   gabapentin 100 MG capsule Commonly known as: NEURONTIN Take 2 capsules (200 mg total) by mouth every 12 (twelve) hours.   GLUCOSAMINE-CHONDROITIN PO Take 1 tablet by mouth daily.   losartan 100 MG tablet Commonly known as: COZAAR Take 100 mg by mouth daily. Notes to patient: Monitor Blood Pressure Daily and keep a log for primary care physician.  You may need to make changes to your medications with rapid weight loss.     Lumify 0.025 % Soln Generic drug: Brimonidine Tartrate Place 1 drop into both eyes daily.   metoprolol succinate 25 MG 24 hr tablet Commonly known as: TOPROL-XL Take 25 mg by mouth daily. Notes to patient: Monitor Blood Pressure Daily and keep a log for primary care physician.  You may need to make changes to your medications with rapid weight loss.     MSM 1000 MG Caps Take 3,000 mg by mouth daily.   ondansetron 4 MG disintegrating tablet Commonly known as: ZOFRAN-ODT Take 1 tablet (4 mg total) by mouth every  6 (six) hours as needed for nausea or vomiting.   pantoprazole 40 MG tablet Commonly known as: PROTONIX Take 1 tablet (40 mg total) by mouth daily.   traMADol 50 MG tablet Commonly known as: ULTRAM Take 1 tablet (50 mg total) by mouth every 6 (six) hours as needed (pain).   Turmeric 500 MG Caps Take 500 mg by mouth daily.   vitamin C 1000 MG tablet Take 2,000 mg by mouth daily.   zinc gluconate 50 MG tablet Take 50 mg by mouth daily.      Follow-up Information    Surgery, [place]. Go on 02/01/2019.   Specialty: General Surgery Why: at 1045 Contact information: [address] [place]  [address] [phone]        Surgery, [place] .   Specialty: General Surgery Contact information: [address] [place] [address] [phone]           Signed: [name] 01/10/2019, 7:44 AM

## 2019-01-10 NOTE — Progress Notes (Signed)
Nutrition Note  RD consulted for diet education for patient s/p bariatric surgery. While RDs are working remotely, Bariatric nurse coordinator providing post-op diet education.   If nutrition issues arise, please consult RD.   Clayton Bibles, MS, RD, [place] Dietitian Pager: [phone] After Hours Pager: [phone]

## 2019-01-10 NOTE — Progress Notes (Signed)
Patient alert and oriented, pain is controlled. Patient is tolerating fluids, advanced to protein shake today, patient is tolerating well.  Reviewed Gastric sleeve discharge instructions with patient and patient is able to articulate understanding.  Provided information on BELT program, Support Group and [facility] outpatient pharmacy. All questions answered, will continue to monitor.  Total fluid intake 960 Per dehydration protocol call back one week post op 

## 2019-01-10 NOTE — TOC Transition Note (Signed)
Transition of Care [facility]) - CM/SW Discharge Note   Patient Details  Name: Barbara Buckley MRN: [id number] Date of Birth: Jan 13, 1961  Transition of Care [facility]) CM/SW Contact:  Leeroy Cha, RN Phone Number: 01/10/2019, 1:19 PM   Clinical Narrative:    Home with self care   Final next level of care: Home/Self Care Barriers to Discharge: No Barriers Identified   Patient Goals and CMS Choice Patient states their goals for this hospitalization and ongoing recovery are:: toy instructions and to loss thw weight CMS Medicare.gov Compare Post Acute Care list provided to:: Patient    Discharge Placement                       Discharge Plan and Services   Discharge Planning Services: CM Consult                                 Social Determinants of Health (SDOH) Interventions     Readmission Risk Interventions No flowsheet data found.

## 2019-01-10 NOTE — Progress Notes (Signed)
Discharge instructions given to pt and all questions were answered.  

## 2019-01-16 ENCOUNTER — Telehealth (HOSPITAL_COMMUNITY): Payer: Self-pay

## 2019-01-16 NOTE — Telephone Encounter (Signed)
Patient called to discuss post bariatric surgery follow up questions.  See below:   1.  Tell me about your pain and pain management?denies pain, no meds used  2.  Let's talk about fluid intake.  How much total fluid are you taking in?65 ounces yesterday  3.  How much protein have you taken in the last 2 days?60  4.  Have you had nausea?  Tell me about when have experienced nausea and what you did to help?denies nausea, no meds used  5.  Has the frequency or color changed with your urine?urinating regularly light in color  6.  Tell me what your incisions look like?itchy light bruising no real issue  7.  Have you been passing gas? BM?passing gas had bm  8.  If a problem or question were to arise who would you call?  Do you know contact numbers for [facility], [facility], and [facility]?aware of how to contact all services  9.  How has the walking going?walking frequently  10.  How are your vitamins and calcium going?  How are you taking them?taking vitaminss as prescribed

## 2019-01-23 ENCOUNTER — Other Ambulatory Visit: Payer: Self-pay

## 2019-01-23 ENCOUNTER — Encounter: Payer: Self-pay | Admitting: Dietician

## 2019-01-23 ENCOUNTER — Encounter: Payer: BC Managed Care – PPO | Attending: Surgery | Admitting: Dietician

## 2019-01-23 VITALS — Ht 66.0 in | Wt 248.1 lb

## 2019-01-23 DIAGNOSIS — Z6841 Body Mass Index (BMI) 40.0 and over, adult: Secondary | ICD-10-CM

## 2019-01-23 NOTE — Progress Notes (Signed)
Follow-up visit:  2 Weeks Post-Operative Sleeve Gastrectomy Surgery  Medical Nutrition Therapy:  Appt start time: 1000 end time:  1050.  Primary concerns today: Post-operative Bariatric Surgery Nutrition Management.   Learning Readiness:   Change in progress  Weight: 248.1lbs Height: 5'6" Weight at previous [facility] visit: 258.7lbs 07/06/18 266.8lbs 12/26/18   Progress:   Patient reports main symptom since surgery has been fatigue. She feels some mild nausea for the past 2 days, feels she might be "full" of protein shakes and ready for change.   Constipation for several days, took medicinal aid yesterday with some relief.   Weight loss of 17.3lbs since attending pre-op class on 12/26/18. Patient wants to make sure she is losing at an appropriate rate.    Dietary recall: Breakfast: Premiere Protein shake 30g protein Snack: water with protein 2O  Lunch: Premier Protein shake Snack: water with Protein 2O  Dinner: soup 1 can over 45 minutes Snack: water with Protein 2O  Fluid intake: 24oz water 2x daily + 22oz protein shakes = 70oz Estimated total protein intake: 80g daily  Medications: losartan, metoprolol succinate, pantoprazole Supplementation: bariatric multivitamin 2x daily + calcium carbonate 3x daily  Using straws: no Drinking while eating: no Hair loss: no Carbonated beverages: no N/V/D/C: constipation (took medicinal aid yesterday); mild nausea x 2 days, no V, D Dumping syndrome: no   Recent physical activity:  Light walking 10-15 minutes, 3-4x a week  Progress Towards Goal(s):  In progress.  Handouts given during visit include:  Phase 3 and Phase 4 bariatric diet handouts   Nutritional Diagnosis:  [place]-3.3 Overweight/obesity As related to history of excess calories.  As evidenced by patient with current BMI of 40, following bariatric diet guidelines after weight loss surgery.    Intervention:    Reviewed progress since previous visit and surgery.  Discussed  diet transition to solid protein foods, importance of monitoring protein and fluid intake and adhering to diet guidelines.  Discussed options for vitamin and mineral intake and post-op vitamin and mineral needs.   Established goals for safe and gradual dietary advancement.  Teaching Method Utilized:  Visual Auditory Hands on  Barriers to learning/adherence to lifestyle change: none  Demonstrated degree of understanding via:  Teach Back   Monitoring/Evaluation:  Dietary intake, exercise, and body weight. Follow up in 6 weeks for 2 month post-op visit: 03/13/19 at 4:15pm

## 2019-01-23 NOTE — Patient Instructions (Signed)
   Begin adding solid protein foods and reducing protein shakes gradually. Monitor daily protein intake to ensure you continue to meet the goal of 60grams protein daily.   Chew thoroughly, take small bites, and avoid drinking fluids during meals and 30 minutes after.   Great job meeting protein and fluid goals so far!

## 2019-01-24 ENCOUNTER — Ambulatory Visit: Payer: BC Managed Care – PPO

## 2019-02-03 ENCOUNTER — Encounter: Payer: Self-pay | Admitting: Dietician

## 2019-02-03 NOTE — Progress Notes (Signed)
Received the following email questions from patient and responded accordingly:  Thank you Sent from my iPhone   On Feb 02, 2019, at 9:49 AM, Barbara Buckley, [email or web address]> wrote: This message was sent securely by [facility].        Hi Barbara, Definitely eat your protein before any vegetables, and add a vegetable only when you do have enough room for more food. If you are craving vegetables, there are creative ways you can turn a vegetable into a "protein" food by adding supplemental protein powder. In vegetable soup, for instance, or in a tomato sauce or salsa. Or add a sauce or salsa to your meat.    As you gradually are able to eat more food at a time, you can increase your vegetable and protein portions, there is no specific rule other than ensuring you meet your protein goal of 60grams or a little more. No need to eat more than 80grams of protein in a day. So do continue to eat slowly, take small bites, chew thoroughly, and listen to what your body is telling you about hunger and fulness to know when to stop eating.    When we meet in October, we will evaluate where you are in terms of adding foods to your diet, and progress on to adding some starchier vegetables and/or fruits gradually between then and the 6 month point. Other starches will still be essentially out of the diet along with nuts and seeds. Fiber intake will gradually increase as you add more vegetables, fruits, and lastly whole grains into your diet. For now it won't be high but your system still needs to finish healing to be able to tolerate more.    So for now, carbs/ sugar will still be very low, likely 5g or less at a time. After our next visit, carb intake might increase to 10g or so on average but still pretty low.  We don't have any definite rules about fat intake other than keeping it very low for at least 3 months. That means avoiding most processed meats like sausage, hot dogs, pepperoni, salami, bologna,  spam, etc. unless you can find a lowfat version. Also keep any added fats like butter, mayo, salad dressing to 1 tsp or less. But you are not likely to need more given the small food portions.    You won't have to worry about calories for a while, you won't be able to take in enough to prevent weight loss. When we get to the 6 month point, we will talk about calories more; I usually advise a maintenance level of 1200-1500 calories depending on the person which helps you better meet your daily nutritional needs.   I hope that answers your questions, please let me know if you have any other questions or concerns.  Regards, Barbara MuldersPam   Barbara Buckley , RD, LDN, CDE [facility], [facility] [phone] [email or web address] [email or web address]     -----Original Message----- From: Christiane HaLeeAnn Buckley [email or web address]>  Sent: Thursday, February 02, 2019 8:45 AM To: Barbara Buckley, [email or web address]> Subject: Questions.    *Caution - External email - see footer for warnings*   Good morning Barbara Buckley,   This is Automatic DataLeeAnn Buckley. I had my bypass surgery on August 3rd and I have my post-op appointment with you last Monday. I have a few questions.   I am scheduled to start vegetables this next Monday. I'm I supposed to eat as many of the required vegetables along with my two ounces of protein  or do I eat my protein first and then eat the vegetables. I'm  full after eating my 2 ounces of protein I can't image eating more.    Also,  I don't see you until October and I'm sure I will be able to tolerate more then the 2 ounces of protein, do I just let my body tell me when to stop?     Are there guidelines as to how much sugar, carbohydrates, fiber, fat, and calories I should be watching for.    Thank you, Barbara Buckley.  [phone]     Sent from my iPhone WARNING: This email originated outside of [facility]. Even if this looks like a [employer], it is not. Do  not provide your username, password, or any other personal information in response to this or any other email. [facility] will never ask you for your username or password via email. DO NOT CLICK links or attachments unless you are positive the content is safe. If in doubt about the safety of this message, select the [employer] button, which forwards to IT Security.      NOTICE: This message may contain confidential information intended only for the recipient. If you have received this communication in error, please notify the sender immediately by replying to the message and deleting it from your computer.

## 2019-02-07 ENCOUNTER — Encounter: Payer: BLUE CROSS/BLUE SHIELD | Admitting: Occupational Therapy

## 2019-02-09 ENCOUNTER — Encounter: Payer: BLUE CROSS/BLUE SHIELD | Admitting: Occupational Therapy

## 2019-03-13 ENCOUNTER — Other Ambulatory Visit: Payer: Self-pay

## 2019-03-13 ENCOUNTER — Encounter: Payer: Self-pay | Admitting: Dietician

## 2019-03-13 ENCOUNTER — Encounter: Payer: BC Managed Care – PPO | Attending: Surgery | Admitting: Dietician

## 2019-03-13 VITALS — Ht 66.0 in | Wt 231.8 lb

## 2019-03-13 DIAGNOSIS — Z6837 Body mass index (BMI) 37.0-37.9, adult: Secondary | ICD-10-CM

## 2019-03-13 DIAGNOSIS — E6609 Other obesity due to excess calories: Secondary | ICD-10-CM | POA: Diagnosis not present

## 2019-03-13 NOTE — Patient Instructions (Addendum)
   Continue to eat every 3-4 hours during the day and include lean proteins and low-carb veggies. Great job so far!  Expand vegetable choices to include salad, spaghetti squash in place of pasta, etc.   If you do include any fruit or starchy vegetable, make sure to keep portions very small and eat them only after eating your protein.

## 2019-03-13 NOTE — Progress Notes (Signed)
Follow-up visit:  2 Months Post-Operative Sleeve gastrectomy Surgery  Medical Nutrition Therapy:  Appt start time: 8295 end time:  6213.  Primary concerns today: Post-operative Bariatric Surgery Nutrition Management.   Learning Readiness:   Change in progress  Weight: 231.8lbs Height: 5'6" Weight at previous NDES visit: 248.1lbs Weight loss of 16.3lbs in the past 6 weeks.  Muscle mass stable at 62.4lbs Percent body fat decreased by 3.6%, now 50.7%     Dietary recall: Breakfast: 1 egg cooked in small amount avocado oil Snack: water with 1/2 sugar free flavoring + unflavored protein powder, drinks throughout the day (30g protein) Lunch: 12-2pm depending on work schedule-- hamburger patty; leftovers Snack: feels hungry about 1 hour after eating, will have 1/2 sandwich, few nuts  Dinner: meat sauce; 10/4 shrimp with 3-4 pcs bowtie pasta with white sauce; chili; mashed cauliflower with pork chop;  Snack: usually none  Fluid intake: >64oz  Estimated total protein intake: 60-70g daily  Medications: clindamycin eye drops, losartan, metoprolol, pantoprazole, triamcinolone cream Supplementation: Bariatric multivitamin 2x daily + calcium carbonate 3x daily  Using straws: no Drinking while eating: yes; has tried to avoid but having difficulty. Hair loss: no Carbonated beverages: no N/V/D/C: occasional constipation relieved with Milk of Magnesia Dumping syndrome: no   Recent physical activity:  Cardio class 2x a week + walking 2-3 times a week  Progress Towards Goal(s):  In progress.  Handouts given during visit include:  Phase 5 and 6 bariatric diet handouts  800 calorie bariatric menus (AND)  Goals and instructions   Nutritional Diagnosis:  [place]-3.3 Overweight/obesity As related to history of excess calories and inactivity.  As evidenced by patient with current BMI of 37.4, following bariatric diet guidelines for ongoing weight loss after sleeve gastrectomy.    Intervention:     Reviewed progress since previous visit. Discussed changes in body composition using report from today's measurement.  Patient has expanded diet and tolerated all foods she has tried, even those not yet recommended. She reports sometimes feeling hungry soon after eating, but is also drinking some fluids with meals. She feels she probably is eating too quickly.   Discussed importance of avoiding relapse into unhealthy food choices and habits, dealing with food cravings, balanced nutrition.   Discussed strategies for slower eating and importance of chewing foods thoroughly.   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   Encouraged patient to continue to eating protein foods first, followed by low-carb vegetables. Patient will increase variety of vegetable intake by adding salads.   Discussed importance of lifelong low fat and low sugar food choices and maximizing nutrition from food choices.   Teaching Method Utilized:  Visual Auditory Hands on  Barriers to learning/adherence to lifestyle change: none  Demonstrated degree of understanding via:  Teach Back   Monitoring/Evaluation:  Dietary intake, exercise, and body weight. Follow up in 4 months for 6 month post-op visit.

## 2019-05-01 LAB — HM PAP SMEAR: HM Pap smear: NORMAL

## 2019-05-02 ENCOUNTER — Other Ambulatory Visit: Payer: Self-pay | Admitting: Obstetrics & Gynecology

## 2019-05-02 DIAGNOSIS — Z1231 Encounter for screening mammogram for malignant neoplasm of breast: Secondary | ICD-10-CM

## 2019-06-23 ENCOUNTER — Ambulatory Visit
Admission: RE | Admit: 2019-06-23 | Discharge: 2019-06-23 | Disposition: A | Discharge status: Home / Self Care | Payer: BC Managed Care – PPO | Source: Ambulatory Visit | Attending: Obstetrics & Gynecology | Admitting: Obstetrics & Gynecology

## 2019-06-23 DIAGNOSIS — Z1231 Encounter for screening mammogram for malignant neoplasm of breast: Secondary | ICD-10-CM | POA: Diagnosis present

## 2019-07-17 ENCOUNTER — Encounter: Payer: Self-pay | Admitting: Dietician

## 2019-07-17 ENCOUNTER — Encounter: Payer: BC Managed Care – PPO | Attending: Surgery | Admitting: Dietician

## 2019-07-17 ENCOUNTER — Other Ambulatory Visit: Payer: Self-pay

## 2019-07-17 VITALS — Ht 66.0 in | Wt 218.2 lb

## 2019-07-17 DIAGNOSIS — E6609 Other obesity due to excess calories: Secondary | ICD-10-CM | POA: Diagnosis not present

## 2019-07-17 DIAGNOSIS — Z6835 Body mass index (BMI) 35.0-35.9, adult: Secondary | ICD-10-CM

## 2019-07-17 NOTE — Progress Notes (Signed)
Follow-up visit:  6 Months Post-Operative sleeve gastrectomy Surgery  Medical Nutrition Therapy:  Appt start time: 1615 end time:  1700.  Primary concerns today: Post-operative Bariatric Surgery Nutrition Management.   Learning Readiness:   Change in progress  Weight: 218.2lbs Height: 5'6" Weight at previous NDES visit: 231.8lbs 03/13/19    Progress:  Patient feels comfortable with her rate of weight loss.   She feels that underlying inflammation has significantly decreased, she feels much lighter and smaller.   Patient has found she is able to eat any foods without side effects. She continues to manage only small portions. She is now trying to focus on eating nutritious foods and limit or avoid most starchy foods and unhealthy snacks.    She does tend to get hungry sometimes as soon as 2hrs after meals. Often feels very hungry a while after dinner.    Dietary recall: Breakfast: plain cooked oatmeal with water occ with berries; crunchy peanut butter sometimes Snack: none, not hungry  Lunch: 12-3pm -- challenge due to work; salad--  lettuce, avocado, egg, cheese Snack: often none; occ cucumber  Dinner: about 2oz fish, chicken, zucchini pasta with shrimp; occ rice ie brocc chicken and rice casserole; 1pc pizza doesn't eat the end of the crust + salad Snack: leftovers from dinner; has had popcorn or chips but tries to avoid.  Fluid intake: water 24+ oz; occ small amount diet coke Estimated total protein intake: 40grams daily  Medications: acetaminophen, astaxanthin, losartan, metoprolol, triamcinolone cream, clindamycin eye drops  Supplementation: bariatric multivitamin 1x a day + calcium 2-3x daily + fresh ginger and turmeric "shot" qam + vitamin C  Using straws: no Drinking while eating: no Hair loss: no Carbonated beverages: occasional small portion N/V/D/C: none other than occasional constipation Dumping syndrome: no   Recent physical activity:  Occasional light  walking; was engaging in exercise program prior to Christmas, and has not yet resumed.   Progress Towards Goal(s):  In progress.  Handouts given during visit include:  Phase VII bariatric diet handout  800-1000kcal bariatric menus (AND)  Goals and instructions (AVS)   Nutritional Diagnosis:  [place]-3.3 Overweight/obesity As related to history of excess calories and inactivity.  As evidenced by patient with current BMI of 35.2, following bariatric diet guidelines for ongoing weight loss after sleeve gastrectomy.    Intervention:    Reviewed progress since previous visit on 03/13/19.  Reviewed goals for protein and fluid intake, and options for meeting those goals.  Discussed progression of diet; patient is including all food groups with continued emphasis on protein and low carb vegetables.   Lack of snacks during the day and some late lunch meals add challenge to meeting intake needs for protein and fluid. Updated dietary goals to include adequate protein and fluids.   Discussed options for resuming/ increasing daily activity and benefits of maintaining lean body weight as well as healthy metabolism.  Teaching Method Utilized:  Visual Auditory Hands on  Barriers to learning/adherence to lifestyle change: none  Demonstrated degree of understanding via:  Teach Back   Monitoring/Evaluation:  Dietary intake, exercise, and body weight.      Follow up 10/16/19 for 9 month post-op visit.

## 2019-07-17 NOTE — Patient Instructions (Addendum)
   Increase protein intake by including protein drink if hungry at night, and/or if unable to have a meal break at work.   Focus on slow eating.   Continue to gradually increase physical activity to preserve muscle and continue to reduce body fat.   Aim for 64oz of fluid each day, remember that all fluids count toward this goal.

## 2019-10-07 DIAGNOSIS — N3090 Cystitis, unspecified without hematuria: Secondary | ICD-10-CM

## 2019-10-07 HISTORY — DX: Cystitis, unspecified without hematuria: N30.90

## 2019-10-16 ENCOUNTER — Other Ambulatory Visit: Payer: Self-pay

## 2019-10-16 ENCOUNTER — Encounter: Payer: BC Managed Care – PPO | Admitting: Dietician

## 2019-10-17 ENCOUNTER — Telehealth: Payer: Self-pay | Admitting: Dietician

## 2019-10-17 NOTE — Telephone Encounter (Signed)
Called patient to reschedule her cancelled appointment from 10/16/19; offered upcoming available times and requested a call back.

## 2019-10-29 ENCOUNTER — Emergency Department: Payer: BC Managed Care – PPO

## 2019-10-29 ENCOUNTER — Other Ambulatory Visit: Payer: Self-pay

## 2019-10-29 ENCOUNTER — Encounter: Payer: Self-pay | Admitting: Emergency Medicine

## 2019-10-29 DIAGNOSIS — R109 Unspecified abdominal pain: Secondary | ICD-10-CM | POA: Diagnosis present

## 2019-10-29 DIAGNOSIS — N3001 Acute cystitis with hematuria: Secondary | ICD-10-CM | POA: Insufficient documentation

## 2019-10-29 DIAGNOSIS — N183 Chronic kidney disease, stage 3 unspecified: Secondary | ICD-10-CM | POA: Diagnosis not present

## 2019-10-29 DIAGNOSIS — K8020 Calculus of gallbladder without cholecystitis without obstruction: Secondary | ICD-10-CM | POA: Diagnosis not present

## 2019-10-29 DIAGNOSIS — I129 Hypertensive chronic kidney disease with stage 1 through stage 4 chronic kidney disease, or unspecified chronic kidney disease: Secondary | ICD-10-CM | POA: Insufficient documentation

## 2019-10-29 DIAGNOSIS — Z79899 Other long term (current) drug therapy: Secondary | ICD-10-CM | POA: Insufficient documentation

## 2019-10-29 DIAGNOSIS — R1011 Right upper quadrant pain: Secondary | ICD-10-CM | POA: Insufficient documentation

## 2019-10-29 DIAGNOSIS — R110 Nausea: Secondary | ICD-10-CM | POA: Insufficient documentation

## 2019-10-29 LAB — COMPREHENSIVE METABOLIC PANEL
ALT: 24 U/L (ref 0–44)
AST: 66 U/L — ABNORMAL HIGH (ref 15–41)
Albumin: 4.5 g/dL (ref 3.5–5.0)
Alkaline Phosphatase: 68 U/L (ref 38–126)
Anion gap: 12 (ref 5–15)
BUN: 25 mg/dL — ABNORMAL HIGH (ref 6–20)
CO2: 23 mmol/L (ref 22–32)
Calcium: 9.5 mg/dL (ref 8.9–10.3)
Chloride: 105 mmol/L (ref 98–111)
Creatinine, Ser: 0.83 mg/dL (ref 0.44–1.00)
GFR calc Af Amer: >60 mL/min (ref >60)
GFR calc non Af Amer: >60 mL/min (ref >60)
Glucose, Bld: 111 mg/dL — ABNORMAL HIGH (ref 70–99)
Potassium: 4.2 mmol/L (ref 3.5–5.1)
Sodium: 140 mmol/L (ref 135–145)
Total Bilirubin: 0.9 mg/dL (ref 0.3–1.2)
Total Protein: 7.7 g/dL (ref 6.5–8.1)

## 2019-10-29 LAB — URINALYSIS, COMPLETE (UACMP) WITH MICROSCOPIC
Bilirubin Urine: NEGATIVE
Glucose, UA: NEGATIVE mg/dL
Hgb urine dipstick: NEGATIVE
Ketones, ur: 20 mg/dL — AB
Nitrite: POSITIVE — AB
Protein, ur: NEGATIVE mg/dL
Specific Gravity, Urine: 1.026 (ref 1.005–1.030)
Squamous Epithelial / HPF: NONE SEEN (ref 0–5)
pH: 6 (ref 5.0–8.0)

## 2019-10-29 LAB — CBC
HCT: 40.4 % (ref 36.0–46.0)
Hemoglobin: 13.6 g/dL (ref 12.0–15.0)
MCH: 29.6 pg (ref 26.0–34.0)
MCHC: 33.7 g/dL (ref 30.0–36.0)
MCV: 88 fL (ref 80.0–100.0)
Platelets: 225 10*3/uL (ref 150–400)
RBC: 4.59 MIL/uL (ref 3.87–5.11)
RDW: 13.6 % (ref 11.5–15.5)
WBC: 6 10*3/uL (ref 4.0–10.5)
nRBC: 0 % (ref 0.0–0.2)

## 2019-10-29 LAB — LIPASE, BLOOD: Lipase: 36 U/L (ref 11–51)

## 2019-10-29 MED ORDER — FENTANYL CITRATE (PF) 100 MCG/2ML IJ SOLN
50.0000 ug | INTRAMUSCULAR | Status: DC | PRN
  Administered 2019-10-29: 50 ug via NASAL
  Filled 2019-10-29: qty 2

## 2019-10-29 NOTE — ED Triage Notes (Signed)
Pt here for severe RUQ pain started after eating. Appears in significant pain.  No vomiting at this time.  Pt cannot sit still.

## 2019-10-30 ENCOUNTER — Emergency Department: Payer: BC Managed Care – PPO

## 2019-10-30 ENCOUNTER — Emergency Department
Admission: EM | Admit: 2019-10-30 | Discharge: 2019-10-30 | Disposition: A | Discharge status: Home / Self Care | Payer: BC Managed Care – PPO | Attending: Emergency Medicine | Admitting: Emergency Medicine

## 2019-10-30 DIAGNOSIS — N3001 Acute cystitis with hematuria: Secondary | ICD-10-CM

## 2019-10-30 DIAGNOSIS — R1011 Right upper quadrant pain: Secondary | ICD-10-CM

## 2019-10-30 DIAGNOSIS — K8020 Calculus of gallbladder without cholecystitis without obstruction: Secondary | ICD-10-CM

## 2019-10-30 LAB — URINE DRUG SCREEN, QUALITATIVE (ARMC ONLY)
Amphetamines, Ur Screen: NOT DETECTED
Barbiturates, Ur Screen: NOT DETECTED
Benzodiazepine, Ur Scrn: NOT DETECTED
Cannabinoid 50 Ng, Ur ~~LOC~~: NOT DETECTED
Cocaine Metabolite,Ur ~~LOC~~: NOT DETECTED
MDMA (Ecstasy)Ur Screen: NOT DETECTED
Methadone Scn, Ur: NOT DETECTED
Opiate, Ur Screen: NOT DETECTED
Phencyclidine (PCP) Ur S: NOT DETECTED
Tricyclic, Ur Screen: NOT DETECTED

## 2019-10-30 MED ORDER — ONDANSETRON HCL 4 MG/2ML IJ SOLN
4.0000 mg | Freq: Once | INTRAMUSCULAR | Status: AC
  Administered 2019-10-30: 4 mg via INTRAVENOUS
  Filled 2019-10-30: qty 2

## 2019-10-30 MED ORDER — KETOROLAC TROMETHAMINE 30 MG/ML IJ SOLN
15.0000 mg | Freq: Once | INTRAMUSCULAR | Status: AC
  Administered 2019-10-30: 15 mg via INTRAVENOUS
  Filled 2019-10-30: qty 1

## 2019-10-30 MED ORDER — MORPHINE SULFATE (PF) 4 MG/ML IV SOLN
4.0000 mg | Freq: Once | INTRAVENOUS | Status: AC
  Administered 2019-10-30: 4 mg via INTRAVENOUS
  Filled 2019-10-30: qty 1

## 2019-10-30 MED ORDER — SODIUM CHLORIDE 0.9 % IV SOLN
1.0000 g | Freq: Once | INTRAVENOUS | Status: AC
  Administered 2019-10-30: 1 g via INTRAVENOUS
  Filled 2019-10-30: qty 10

## 2019-10-30 MED ORDER — CEPHALEXIN 500 MG PO CAPS
500.0000 mg | ORAL_CAPSULE | Freq: Three times a day (TID) | ORAL | 0 refills | Status: AC
Start: 2019-10-30 — End: 2019-11-06

## 2019-10-30 MED ORDER — ONDANSETRON 4 MG PO TBDP: 4.0000 mg | ORAL_TABLET | Freq: Three times a day (TID) | ORAL | 0 refills | Status: DC | PRN

## 2019-10-30 NOTE — ED Notes (Signed)
Pt reports rapid onset of diffuse abdominal pain at 2000, nausea no emesis and severe stabbing RUQ pain, pt reports gastric sleeve and "bladder tack" surgeries   Husband at bedside

## 2019-10-30 NOTE — ED Notes (Signed)
Pt to CT att

## 2019-10-30 NOTE — ED Provider Notes (Signed)
[facility] Emergency Department Provider Note  ____________________________________________  Time seen: Approximately 4:38 AM  I have reviewed the triage vital signs and the nursing notes.   HISTORY  Chief Complaint Abdominal Pain   HPI Barbara Buckley is a 59 y.o. female with history of gastric bypass in August 2020, chronic kidney disease, hypertension, OSA on CPAP who presents for evaluation of abdominal pain.  Patient reports sudden onset of severe sharp right-sided abdominal pain associated with nausea.  No vomiting, no chest pain, no shortness of breath, no diarrhea or constipation.  Patient has been passing gas, no abdominal distention.  Pain started after she had dinner, she ate a hamburger patty and potato salad.   Past Medical History:  Diagnosis Date  . Anxiety    at night  . CKD (chronic kidney disease), stage III   . GERD (gastroesophageal reflux disease)    history of  . History of cold sores   . Hypertension   . Lymphedema    Bilateral lower extremity  . Migraines    history of  . Obesity   . OSA on CPAP   . PONV (postoperative nausea and vomiting)   . Restless leg syndrome   . Skin rash    due to stress  . Tachycardia   . Vitamin D deficiency     Patient Active Problem List   Diagnosis Date Noted  . S/P laparoscopic sleeve gastrectomyAugust 2020 01/09/2019  . SVT (supraventricular tachycardia) ([place]) 12/21/2018  . OSA (obstructive sleep apnea) 12/21/2018  . Essential hypertension 12/21/2018    Past Surgical History:  Procedure Laterality Date  . BLADDER SUSPENSION    . CARDIAC CATHETERIZATION    . CESAREAN SECTION     x2  . COLONOSCOPY    . FOOT SURGERY Right    x2  . LAPAROSCOPIC GASTRIC SLEEVE RESECTION N/A 01/09/2019   Procedure: LAPAROSCOPIC GASTRIC SLEEVE RESECTION, UPPER ENDO;  Surgeon: Johnathan Hausen, MD;  Location: [facility];  Service: General;  Laterality: N/A;  . TONSILLECTOMY AND ADENOIDECTOMY    .  UPPER GI ENDOSCOPY    . WISDOM TOOTH EXTRACTION      Prior to Admission medications   Medication Sig Start Date End Date Taking? Authorizing Provider  acetaminophen (TYLENOL) 325 MG tablet Take 650 mg by mouth every 6 (six) hours as needed for moderate pain or headache.    [provider]  Ascorbic Acid (VITAMIN C) 1000 MG tablet Take 2,000 mg by mouth daily.    [provider]  ASTAXANTHIN PO Take 12 mg by mouth daily.    [provider]  b complex vitamins tablet Take 1 tablet by mouth daily.    [provider]  Brimonidine Tartrate (LUMIFY) 0.025 % SOLN Place 1 drop into both eyes daily.    [provider]  cephALEXin (KEFLEX) 500 MG capsule Take 1 capsule (500 mg total) by mouth 3 (three) times daily for 7 days. 10/30/19 11/06/19  Alfred Levins, [place], MD  clindamycin (CLEOCIN T) 1 % external solution APPLY TOPICALLY TO AFFECTED SITES ON SCALP TWICE A DAY UNTIL CLEAR THEN AS NEEDED. 12/30/18   [provider]  Flaxseed, Linseed, (FLAX SEED OIL PO) Take 1,200 mg by mouth daily.    [provider]  GLUCOSAMINE-CHONDROITIN PO Take 1 tablet by mouth daily.    [provider]  losartan (COZAAR) 100 MG tablet Take 100 mg by mouth daily. 06/19/18   [provider]  Methylsulfonylmethane (MSM) 1000 MG CAPS  Take 3,000 mg by mouth daily.     [provider]  metoprolol succinate (TOPROL-XL) 25 MG 24 hr tablet Take 25 mg by mouth daily. 06/19/18   [provider]  Multiple Vitamins-Minerals (BARIATRIC MULTIVITAMINS/IRON PO) Take 1 tablet by mouth daily.    [provider]  ondansetron (ZOFRAN ODT) 4 MG disintegrating tablet Take 1 tablet (4 mg total) by mouth every 8 (eight) hours as needed. 10/30/19   Rudene Re, MD  pantoprazole (PROTONIX) 40 MG tablet Take 1 tablet (40 mg total) by mouth daily. 01/10/19   Johnathan Hausen, MD  triamcinolone cream (KENALOG) 0.1 % APPLY TOPICALLY TO ITCHY SKIN  TWICE DAILY UP TO 2 WEEKS A MONTH AS NEEDED. 12/30/18   [provider]  Turmeric 500 MG CAPS Take 500 mg by mouth daily.    [provider]  zinc gluconate 50 MG tablet Take 50 mg by mouth daily.    [provider]    Allergies Lisinopril  Family History  Problem Relation Age of Onset  . Breast cancer Paternal Aunt        x 2  ? age    Social History Social History   Tobacco Use  . Smoking status: Never Smoker  . Smokeless tobacco: Never Used  Substance Use Topics  . Alcohol use: Not Currently    Comment: occasional 0-2 drinks per month  . Drug use: Never    Review of Systems  Constitutional: Negative for fever. Eyes: Negative for visual changes. ENT: Negative for sore throat. Neck: No neck pain  Cardiovascular: Negative for chest pain. Respiratory: Negative for shortness of breath. Gastrointestinal: + abdominal pain and nausea. No vomiting or diarrhea. Genitourinary: Negative for dysuria. Musculoskeletal: Negative for back pain. Skin: Negative for rash. Neurological: Negative for headaches, weakness or numbness. Psych: No SI or HI  ____________________________________________   PHYSICAL EXAM:  VITAL SIGNS: ED Triage Vitals  Enc Vitals Group     BP 10/29/19 2143 (!) 143/83     Pulse Rate 10/29/19 2143 66     Resp 10/29/19 2143 20     Temp 10/29/19 2143 97.6 F (36.4 C)     Temp Source 10/29/19 2143 Oral     SpO2 10/29/19 2143 100 %     Weight 10/29/19 2140 205 lb (93 kg)     Height 10/29/19 2140 '5\' 6"'  (1.676 m)     Head Circumference --      Peak Flow --      Pain Score 10/29/19 2140 10     Pain Loc --      Pain Edu? --      Excl. in [place]? --     Constitutional: Alert and oriented.  Looks extremely uncomfortable due to pain  HEENT:      Head: Normocephalic and atraumatic.         Eyes: Conjunctivae are normal. Sclera is non-icteric.       Mouth/Throat: Mucous membranes are moist.       Neck: Supple with no signs of  meningismus. Cardiovascular: Regular rate and rhythm. No murmurs, gallops, or rubs. 2+ symmetrical distal pulses are present in all extremities. No JVD. Respiratory: Normal respiratory effort. Lungs are clear to auscultation bilaterally. No wheezes, crackles, or rhonchi.  Gastrointestinal: Soft, right upper quadrant tenderness with positive Murphy sign, and non distended with positive bowel sounds. No rebound or guarding. Genitourinary: No CVA tenderness. Musculoskeletal: Nontender with normal range of motion in all extremities. No edema, cyanosis, or erythema  of extremities. Neurologic: Normal speech and language. Face is symmetric. Moving all extremities. No gross focal neurologic deficits are appreciated. Skin: Skin is warm, dry and intact. No rash noted. Psychiatric: Mood and affect are normal. Speech and behavior are normal.  ____________________________________________   LABS (all labs ordered are listed, but only abnormal results are displayed)  Labs Reviewed  COMPREHENSIVE METABOLIC PANEL - Abnormal; Notable for the following components:      Result Value   Glucose, Bld 111 (*)    BUN 25 (*)    AST 66 (*)    All other components within normal limits  URINALYSIS, COMPLETE (UACMP) WITH MICROSCOPIC - Abnormal; Notable for the following components:   Color, Urine YELLOW (*)    APPearance HAZY (*)    Ketones, ur 20 (*)    Nitrite POSITIVE (*)    Leukocytes,Ua SMALL (*)    Bacteria, UA FEW (*)    All other components within normal limits  LIPASE, BLOOD  CBC  URINE DRUG SCREEN, QUALITATIVE ([facility] ONLY)   ____________________________________________  EKG  ED ECG REPORT I, Rudene Re, the attending physician, personally viewed and interpreted this ECG.  Sinus bradycardia, rate of 53, normal intervals, normal axis, no ST elevations or depressions.  Otherwise normal EKG. ____________________________________________  RADIOLOGY  I have personally reviewed the images  performed during this visit and I agree with the Radiologist's read.   Interpretation by Radiologist:  CT Renal Stone Study  Result Date: 10/30/2019 CLINICAL DATA:  Severe right upper quadrant pain which began after eating EXAM: CT ABDOMEN AND PELVIS WITHOUT CONTRAST TECHNIQUE: Multidetector CT imaging of the abdomen and pelvis was performed following the standard protocol without IV contrast. COMPARISON:  Upper GI 07/05/2018, abdominal ultrasound 10/29/2019 FINDINGS: Lower chest: Lung bases are clear. Normal heart size. No pericardial effusion. Hepatobiliary: No focal liver abnormality is seen. Mild gallbladder distention. Gallstones visualized on ultrasound 10/29/2019 appear to be radiolucent, not visualized on this CT examination. No gallbladder wall thickening, pericholecystic fluid or inflammation, or biliary dilatation. Pancreas: Unremarkable. No pancreatic ductal dilatation or surrounding inflammatory changes. Spleen: Normal in size without focal abnormality. Adrenals/Urinary Tract: 11 mm nodule in the medial limb of the right adrenal gland (2/23) measures -6 Hounsfield units on this noncontrast CT most compatible with a benign adrenal adenoma. No left adrenal lesions. Kidneys are symmetric in size and normally located. No visible or contour deforming renal lesions. No visible urolithiasis or hydronephrosis. Urinary bladder is unremarkable. Stomach/Bowel: There are postsurgical changes from prior sleeve gastrectomy with the superior extent of the suture line extending into the diaphragmatic hiatus. Distal stomach is unremarkable. No small bowel thickening or dilatation. No evidence of obstruction. A normal appendix is visualized. No colonic dilatation or wall thickening. Vascular/Lymphatic: Atherosclerotic calcifications throughout the abdominal aorta and branch vessels. No aneurysm or ectasia. No enlarged abdominopelvic lymph nodes. Reproductive: Anteverted uterus. 2.3 cm simple appearing cyst in the  right adnexa (2/73). No other visible or concerning adnexal lesions. Other: No abdominopelvic free fluid or free gas. No bowel containing hernias. Musculoskeletal: Multilevel degenerative changes are present in the imaged portions of the spine. Additional degenerative changes in the SI joints and hips. Few bone islands in the ilia. No acute osseous abnormality or suspicious osseous lesion. IMPRESSION: 1. Mild gallbladder distention. Gallstones visualized on ultrasound appear to be radiolucent, not visualized on this CT examination. No gallbladder wall thickening, pericholecystic fluid or inflammation, or biliary dilatation. If there is persisting clinical concern for cholecystitis or gallbladder dysfunction, HIDA could  be obtained. 2. No other acute abdominopelvic abnormality to provide a plausible explanation for patient's symptoms. 3. 11 mm right adrenal nodule most compatible with benign adrenal adenoma. 4. 2.3 cm simple appearing cyst in the right adnexa, likely benign. Consider further evaluation with pelvic ultrasound if clinically indicated. This recommendation follows ACR consensus guidelines: White Paper of the ACR Incidental Findings Committee II on Adnexal Findings. J Am Coll Radiol 2013:10:675-681. 5. Postsurgical changes from prior sleeve gastrectomy. 6. Aortic Atherosclerosis (ICD10-I70.0). Electronically Signed   By: Lovena Le M.D.   On: 10/30/2019 01:41   US Abdomen Limited RUQ  Result Date: 10/29/2019 CLINICAL DATA:  Right upper quadrant abdominal pain status post meal. EXAM: ULTRASOUND ABDOMEN LIMITED RIGHT UPPER QUADRANT COMPARISON:  None. FINDINGS: Gallbladder: Multiple shadowing gallstones are noted. The sonographic Percell Miller sign is negative. There is no gallbladder wall thickening or pericholecystic free fluid. Common bile duct: Diameter: 4 mm Liver: Diffuse increased echogenicity with slightly heterogeneous liver. Appearance typically secondary to fatty infiltration. Fibrosis secondary  consideration. No secondary findings of cirrhosis noted. No focal hepatic lesion or intrahepatic biliary duct dilatation. Portal vein is patent on color Doppler imaging with normal direction of blood flow towards the liver. Other: None. IMPRESSION: 1. There is cholelithiasis without secondary signs of acute cholecystitis. 2. Hepatic steatosis. Electronically Signed   By: Constance Holster M.D.   On: 10/29/2019 23:03     ____________________________________________   PROCEDURES  Procedure(s) performed:yes .1-3 Lead EKG Interpretation Performed by: Rudene Re, MD Authorized by: Rudene Re, MD     Interpretation: normal     ECG rate assessment: bradycardic     Rhythm: sinus bradycardia     Ectopy: none     Conduction: normal     Critical Care performed:  None ____________________________________________   INITIAL IMPRESSION / ASSESSMENT AND PLAN / ED COURSE  59 y.o. female with history of gastric bypass in August 2020, chronic kidney disease, hypertension, OSA on CPAP who presents for evaluation of sudden onset severe right-sided abdominal pain and nausea.  Patient looks very uncomfortable due to the pain, she is tender to palpation the right upper quadrant with no rebound or guarding but positive Murphy sign.  Ultrasound done in the waiting room was visualized by me showing cholelithiasis with no evidence of cholecystitis, confirmed by radiology.  Labs showing minimally elevated AST of 66 with normal ALT, normal T bili, normal alk phos, normal white count, and normal lipase.  UA positive for UTI.  CT was done to rule out kidney stone.  No evidence of kidney stones, no signs of acute cholecystitis on CT as well, no signs of SBO especially with recent surgery, no signs of perforation.  Patient was given Rocephin for UTI, IV fentanyl and Toradol for her abdominal pain.  She was monitored for 4 hours after morphine with resolution of the pain.  She is tolerating p.o.  At this  time her presentation was concerning for symptomatic cholelithiasis.  With no signs of acute cholecystitis and resolved pain no indication for emergent surgical evaluation.  She is referred to surgery for outpatient management for outpatient cholecystectomy.  She was given a dose of Rocephin for her UTI and will be discharged home on Keflex.  Discussed my standard return precautions with her and her husband.  History gathered from patient and her husband.  Old medical records reviewed.      _____________________________________________ Please note:  Patient was evaluated in Emergency Department today for the symptoms described in the history of  present illness. Patient was evaluated in the context of the global COVID-19 pandemic, which necessitated consideration that the patient might be at risk for infection with the SARS-CoV-2 virus that causes COVID-19. Institutional protocols and algorithms that pertain to the evaluation of patients at risk for COVID-19 are in a state of rapid change based on information released by regulatory bodies including the CDC and federal and state organizations. These policies and algorithms were followed during the patient's care in the ED.  Some ED evaluations and interventions may be delayed as a result of limited staffing during the pandemic.   [place] Controlled Substance Database was reviewed by me. ____________________________________________   FINAL CLINICAL IMPRESSION(S) / ED DIAGNOSES   Final diagnoses:  Acute cystitis with hematuria  Calculus of gallbladder without cholecystitis without obstruction      NEW MEDICATIONS STARTED DURING THIS VISIT:  ED Discharge Orders         Ordered    ondansetron (ZOFRAN ODT) 4 MG disintegrating tablet  Every 8 hours PRN     10/30/19 0434    cephALEXin (KEFLEX) 500 MG capsule  3 times daily     10/30/19 0434           Note:  This document was prepared using Dragon voice recognition software and may include  unintentional dictation errors.    Alfred Levins, [place], MD 10/30/19 [phone]

## 2019-10-30 NOTE — ED Notes (Addendum)
Peripheral IV discontinued. Catheter intact. No signs of infiltration or redness. Gauze applied to IV site.   Discharge instructions reviewed with patient. Questions fielded by this RN. Patient verbalizes understanding of instructions. Patient discharged home in stable condition per [facility]. No acute distress noted at time of discharge.   Pt wheeled to family car

## 2019-10-30 NOTE — Discharge Instructions (Signed)
As we discussed, make sure to eat a more bland diet and avoid fatty foods.  Call Dr. Hurman Horn office first thing in the morning for an appointment.  Take Zofran for nausea.  If your pain returns and lasts for longer than 30 minutes please return to the emergency room.  Take antibiotics as prescribed for UTI.  Also return to the emergency room for new or worsening abdominal pain, flank pain, vomiting or fever.

## 2019-11-01 ENCOUNTER — Ambulatory Visit (INDEPENDENT_AMBULATORY_CARE_PROVIDER_SITE_OTHER): Payer: BC Managed Care – PPO | Admitting: Surgery

## 2019-11-01 ENCOUNTER — Encounter: Payer: Self-pay | Admitting: Surgery

## 2019-11-01 ENCOUNTER — Telehealth: Payer: Self-pay | Admitting: Surgery

## 2019-11-01 ENCOUNTER — Other Ambulatory Visit: Payer: Self-pay

## 2019-11-01 VITALS — BP 98/67 | HR 79 | Temp 97.2°F | Ht 66.0 in | Wt 204.8 lb

## 2019-11-01 DIAGNOSIS — K8020 Calculus of gallbladder without cholecystitis without obstruction: Secondary | ICD-10-CM | POA: Diagnosis not present

## 2019-11-01 NOTE — Patient Instructions (Addendum)
Our surgery scheduler Pamala Hurry will contact you within 24-48 hours. During that call, she will discuss the preparation prior to surgery and discuss the different dates and times to get scheduled. Please have the BLUE sheet available when she contacts you. If you have any questions or concerns, please give our office a call.   Cholecystitis  Cholecystitis is inflammation of the gallbladder. It is often called a gallbladder attack. The gallbladder is a pear-shaped organ that lies beneath the liver on the right side of the body. The gallbladder stores bile, which is a fluid that helps the body digest fats. If bile builds up in your gallbladder, your gallbladder becomes inflamed. This condition may occur suddenly. Cholecystitis is a serious condition and requires treatment. What are the causes? The most common cause of this condition is gallstones. Gallstones can block the tube (duct) that carries bile out of your gallbladder. This causes bile to build up. Other causes include:  Damage to the gallbladder due to a decrease in blood flow.  Infections in the bile ducts.  Scars or kinks in the bile ducts.  Tumors in the liver, pancreas, or gallbladder. What increases the risk? You are more likely to develop this condition if:  You have sickle cell disease.  You take birth control pills or use estrogen.  You have alcoholic liver disease.  You have liver cirrhosis.  You have your nutrition delivered through a vein (parenteral nutrition).  You are critically ill.  You do not eat or drink for a long time. This is also called "fasting."  You are obese.  You lose weight too fast.  You are pregnant.  You have high levels of fat (triglycerides) in the blood.  You have pancreatitis. What are the signs or symptoms? Symptoms of this condition include:  Pain in the abdomen, especially in the upper right area of the abdomen.  Tenderness or bloating in the  abdomen.  Nausea.  Vomiting.  Fever.  Chills. How is this diagnosed? This condition is diagnosed with a medical history and physical exam. You may also have other tests, including:  Imaging tests, such as: ? An ultrasound of the gallbladder. ? A CT scan of the abdomen. ? A gallbladder nuclear scan (HIDA scan). This scan allows your health care provider to see the bile moving from your liver to your gallbladder and on to your small intestine. ? MRI.  Blood tests, such as: ? A complete blood count. The white blood cell count may be higher than normal. ? Liver function tests. Certain types of gallstones cause some results to be higher than normal. How is this treated? Treatment may include:  Surgery to remove your gallbladder (cholecystectomy).  Antibiotic medicine, usually through an IV.  Fasting for a certain amount of time.  Giving IV fluids.  Medicine to treat pain or vomiting. Follow these instructions at home:  If you had surgery, follow instructions from your health care provider about home care after the procedure. Medicines   Take over-the-counter and prescription medicines only as told by your health care provider.  If you were prescribed an antibiotic medicine, take it as told by your health care provider. Do not stop taking the antibiotic even if you start to feel better. General instructions  Follow instructions from your health care provider about what to eat or drink. When you are allowed to eat, avoid eating or drinking anything that triggers your symptoms.  Do not lift anything that is heavier than 10 lb (4.5 kg), or  the limit that you are told, until your health care provider says that it is safe.  Do not use any products that contain nicotine or tobacco, such as cigarettes and e-cigarettes. If you need help quitting, ask your health care provider.  Keep all follow-up visits as told by your health care provider. This is important. Contact a health  care provider if:  Your pain is not controlled with medicine.  You have a fever. Get help right away if:  Your pain moves to another part of your abdomen or to your back.  You continue to have symptoms or you develop new symptoms even with treatment. Summary  Cholecystitis is inflammation of the gallbladder.  The most common cause of this condition is gallstones. Gallstones can block the tube (duct) that carries bile out of your gallbladder.  Common symptoms are pain in the abdomen, nausea, vomiting, fever, and chills.  This condition is treated with surgery to remove the gallbladder, medicines, fasting, and IV fluids.  Follow your health care provider's instructions for eating and drinking. Avoid eating anything that triggers your symptoms. This information is not intended to replace advice given to you by your health care provider. Make sure you discuss any questions you have with your health care provider. Document Revised: 10/01/2017 Document Reviewed: 10/01/2017 Elsevier Patient Education  2020 [employer].

## 2019-11-01 NOTE — Telephone Encounter (Signed)
Pt has been advised of Pre-Admission date/time, COVID Testing date and Surgery date.  Surgery Date: 11/09/19 Preadmission Testing Date: 11/07/19 (phone 8a-1p) Covid Testing Date: 11/08/19 - patient advised to go to [facility] ([address] [place]) between 8a-1p  Patient has been made aware to call [phone], between 1-3:00pm the day before surgery, to find out what time to arrive for surgery.    

## 2019-11-01 NOTE — Progress Notes (Signed)
Patient ID: Barbara Buckley, female   DOB: 02-04-1961, 59 y.o.   MRN: [id number]  HPI Barbara Buckley is a 59 y.o. female seen in consultation at the request of Dr. Don Perking.  She came to the emergency room 2 days ago for acute onset of pain located in the right upper quadrant, pain was severe and radiated to the back sharp in nature.  She did have associated nausea but no vomiting.  No fevers no chills.  The patient subsided after she was hydrated and IV pain medicine administered.  She states that she continues to have some intermittent pain and discomfort her abdomen and feels sore.  Decreased appetite.  No fevers no chills no evidence of biliary obstruction.  No evidence of cholangitis.  She did have an ultrasound as well as a CT that I have personally reviewed there is evidence of previous sleeve gastrectomy evidence of cholelithiasis.  No cholecystitis normal common bile duct.  CBC and CMP were completely normal. She did have history of a sleeve gastrectomy by Dr. Daphine Deutscher on August last year and did not have any complications.  She is able to perform more than 4 METS of activity without any shortness of breath or chest pain.  She does have a history of palpitations.  HPI  Past Medical History:  Diagnosis Date  . Anxiety    at night  . CKD (chronic kidney disease), stage III   . GERD (gastroesophageal reflux disease)    history of  . History of cold sores   . Hypertension   . Lymphedema    Bilateral lower extremity  . Migraines    history of  . Obesity   . OSA on CPAP   . PONV (postoperative nausea and vomiting)   . Restless leg syndrome   . Skin rash    due to stress  . Tachycardia   . Vitamin D deficiency     Past Surgical History:  Procedure Laterality Date  . BLADDER SUSPENSION    . CARDIAC CATHETERIZATION    . CESAREAN SECTION     x2  . COLONOSCOPY    . FOOT SURGERY Right    x2  . LAPAROSCOPIC GASTRIC SLEEVE RESECTION N/A 01/09/2019   Procedure: LAPAROSCOPIC  GASTRIC SLEEVE RESECTION, UPPER ENDO;  Surgeon: Luretha Murphy, MD;  Location: [facility];  Service: General;  Laterality: N/A;  . TONSILLECTOMY AND ADENOIDECTOMY    . UPPER GI ENDOSCOPY    . WISDOM TOOTH EXTRACTION      Family History  Problem Relation Age of Onset  . Breast cancer Paternal Aunt        x 2  ? age    Social History Social History   Tobacco Use  . Smoking status: Never Smoker  . Smokeless tobacco: Never Used  Substance Use Topics  . Alcohol use: Not Currently    Comment: occasional 0-2 drinks per month  . Drug use: Never    Allergies  Allergen Reactions  . Lisinopril Cough    Current Outpatient Medications  Medication Sig Dispense Refill  . acetaminophen (TYLENOL) 325 MG tablet Take 650 mg by mouth every 6 (six) hours as needed for moderate pain or headache.    . Ascorbic Acid (VITAMIN C) 1000 MG tablet Take 2,000 mg by mouth daily.    . ASTAXANTHIN PO Take 12 mg by mouth daily.    Marland Kitchen b complex vitamins tablet Take 1 tablet by mouth daily.    . Brimonidine Tartrate (LUMIFY) 0.025 %  SOLN Place 1 drop into both eyes daily.    . cephALEXin (KEFLEX) 500 MG capsule Take 1 capsule (500 mg total) by mouth 3 (three) times daily for 7 days. 21 capsule 0  . losartan (COZAAR) 100 MG tablet Take 100 mg by mouth daily.    . metoprolol succinate (TOPROL-XL) 25 MG 24 hr tablet Take 25 mg by mouth daily.    . Multiple Vitamins-Minerals (BARIATRIC MULTIVITAMINS/IRON PO) Take 1 tablet by mouth daily.    Marland Kitchen pyridoxine (B-6) 100 MG tablet Take by mouth.    . Turmeric 500 MG CAPS Take 500 mg by mouth daily.    Marland Kitchen zinc gluconate 50 MG tablet Take 50 mg by mouth daily.    . clindamycin (CLEOCIN T) 1 % external solution APPLY TOPICALLY TO AFFECTED SITES ON SCALP TWICE A DAY UNTIL CLEAR THEN AS NEEDED.    Marland Kitchen Flaxseed, Linseed, (FLAX SEED OIL PO) Take 1,200 mg by mouth daily.    Marland Kitchen GLUCOSAMINE-CHONDROITIN PO Take 1 tablet by mouth daily.    . Methylsulfonylmethane (MSM) 1000 MG CAPS Take  3,000 mg by mouth daily.     . ondansetron (ZOFRAN ODT) 4 MG disintegrating tablet Take 1 tablet (4 mg total) by mouth every 8 (eight) hours as needed. (Patient not taking: Reported on 11/01/2019) 20 tablet 0  . pantoprazole (PROTONIX) 40 MG tablet Take 1 tablet (40 mg total) by mouth daily. (Patient not taking: Reported on 11/01/2019) 90 tablet 0  . triamcinolone cream (KENALOG) 0.1 % APPLY TOPICALLY TO ITCHY SKIN TWICE DAILY UP TO 2 WEEKS A MONTH AS NEEDED.     No current facility-administered medications for this visit.     Review of Systems Full ROS  was asked and was negative except for the information on the HPI  Physical Exam Blood pressure 98/67, pulse 79, temperature (!) 97.2 F (36.2 C), temperature source Temporal, height 5\' 6"  (1.676 m), weight 204 lb 12.8 oz (92.9 kg), SpO2 98 %. CONSTITUTIONAL: NAD EYES: Pupils are equal, round, Sclera are non-icteric. EARS, NOSE, MOUTH AND THROAT: She is wearing a mask. Hearing is intact to voice. LYMPH NODES:  Lymph nodes in the neck are normal. RESPIRATORY:  Lungs are clear. There is normal respiratory effort, with equal breath sounds bilaterally, and without pathologic use of accessory muscles. CARDIOVASCULAR: Heart is regular without murmurs, gallops, or rubs. GI: The abdomen is  soft, mild diffuse soreness, no peritonitis , no Murphy.  There is previous laparoscopic scars there are no palpable masses. There is no hepatosplenomegaly. There are normal bowel sounds in all quadrants. GU: Rectal deferred.   MUSCULOSKELETAL: Normal muscle strength and tone. No cyanosis or edema.   SKIN: Turgor is good and there are no pathologic skin lesions or ulcers. NEUROLOGIC: Motor and sensation is grossly normal. Cranial nerves are grossly intact. PSYCH:  Oriented to person, place and time. Affect is normal.  Data Reviewed  I have personally reviewed the patient's imaging, laboratory findings and medical records.    Assessment/Plan 59 year old female  with biliary colic and crescendo symptoms. Discussed with the patient in detail about her disease process and my recommendations for cholecystectomy.  Do think that she is a good candidate for robotic approach.The risks, benefits, complications, treatment options, and expected outcomes were discussed with the patient. The possibilities of bleeding, recurrent infection, finding a normal gallbladder, perforation of viscus organs, damage to surrounding structures, bile leak, abscess formation, needing a drain placed, the need for additional procedures, reaction to medication, pulmonary aspiration,  failure to diagnose a condition, the possible need to convert to an open procedure, and creating a complication requiring transfusion or operation were discussed with the patient. The patient and/or family concurred with the proposed plan, giving informed consent.   A copy of this report was sent to the referring provider  Caroleen Hamman, MD FACS General Surgeon 11/01/2019, 11:49 AM

## 2019-11-01 NOTE — H&P (View-Only) (Signed)
Patient ID: Barbara Buckley, female   DOB: 06/16/1960, 59 y.o.   MRN: [id number]  HPI Barbara Buckley is a 59 y.o. female seen in consultation at the request of Dr. Veronese.  She came to the emergency room 2 days ago for acute onset of pain located in the right upper quadrant, pain was severe and radiated to the back sharp in nature.  She did have associated nausea but no vomiting.  No fevers no chills.  The patient subsided after she was hydrated and IV pain medicine administered.  She states that she continues to have some intermittent pain and discomfort her abdomen and feels sore.  Decreased appetite.  No fevers no chills no evidence of biliary obstruction.  No evidence of cholangitis.  She did have an ultrasound as well as a CT that I have personally reviewed there is evidence of previous sleeve gastrectomy evidence of cholelithiasis.  No cholecystitis normal common bile duct.  CBC and CMP were completely normal. She did have history of a sleeve gastrectomy by Dr. Martin on August last year and did not have any complications.  She is able to perform more than 4 METS of activity without any shortness of breath or chest pain.  She does have a history of palpitations.  HPI  Past Medical History:  Diagnosis Date  . Anxiety    at night  . CKD (chronic kidney disease), stage III   . GERD (gastroesophageal reflux disease)    history of  . History of cold sores   . Hypertension   . Lymphedema    Bilateral lower extremity  . Migraines    history of  . Obesity   . OSA on CPAP   . PONV (postoperative nausea and vomiting)   . Restless leg syndrome   . Skin rash    due to stress  . Tachycardia   . Vitamin D deficiency     Past Surgical History:  Procedure Laterality Date  . BLADDER SUSPENSION    . CARDIAC CATHETERIZATION    . CESAREAN SECTION     x2  . COLONOSCOPY    . FOOT SURGERY Right    x2  . LAPAROSCOPIC GASTRIC SLEEVE RESECTION N/A 01/09/2019   Procedure: LAPAROSCOPIC  GASTRIC SLEEVE RESECTION, UPPER ENDO;  Surgeon: Martin, Matthew, MD;  Location: [facility];  Service: General;  Laterality: N/A;  . TONSILLECTOMY AND ADENOIDECTOMY    . UPPER GI ENDOSCOPY    . WISDOM TOOTH EXTRACTION      Family History  Problem Relation Age of Onset  . Breast cancer Paternal Aunt        x 2  ? age    Social History Social History   Tobacco Use  . Smoking status: Never Smoker  . Smokeless tobacco: Never Used  Substance Use Topics  . Alcohol use: Not Currently    Comment: occasional 0-2 drinks per month  . Drug use: Never    Allergies  Allergen Reactions  . Lisinopril Cough    Current Outpatient Medications  Medication Sig Dispense Refill  . acetaminophen (TYLENOL) 325 MG tablet Take 650 mg by mouth every 6 (six) hours as needed for moderate pain or headache.    . Ascorbic Acid (VITAMIN C) 1000 MG tablet Take 2,000 mg by mouth daily.    . ASTAXANTHIN PO Take 12 mg by mouth daily.    . b complex vitamins tablet Take 1 tablet by mouth daily.    . Brimonidine Tartrate (LUMIFY) 0.025 %   SOLN Place 1 drop into both eyes daily.    . cephALEXin (KEFLEX) 500 MG capsule Take 1 capsule (500 mg total) by mouth 3 (three) times daily for 7 days. 21 capsule 0  . losartan (COZAAR) 100 MG tablet Take 100 mg by mouth daily.    . metoprolol succinate (TOPROL-XL) 25 MG 24 hr tablet Take 25 mg by mouth daily.    . Multiple Vitamins-Minerals (BARIATRIC MULTIVITAMINS/IRON PO) Take 1 tablet by mouth daily.    Marland Kitchen pyridoxine (B-6) 100 MG tablet Take by mouth.    . Turmeric 500 MG CAPS Take 500 mg by mouth daily.    Marland Kitchen zinc gluconate 50 MG tablet Take 50 mg by mouth daily.    . clindamycin (CLEOCIN T) 1 % external solution APPLY TOPICALLY TO AFFECTED SITES ON SCALP TWICE A DAY UNTIL CLEAR THEN AS NEEDED.    Marland Kitchen Flaxseed, Linseed, (FLAX SEED OIL PO) Take 1,200 mg by mouth daily.    Marland Kitchen GLUCOSAMINE-CHONDROITIN PO Take 1 tablet by mouth daily.    . Methylsulfonylmethane (MSM) 1000 MG CAPS Take  3,000 mg by mouth daily.     . ondansetron (ZOFRAN ODT) 4 MG disintegrating tablet Take 1 tablet (4 mg total) by mouth every 8 (eight) hours as needed. (Patient not taking: Reported on 11/01/2019) 20 tablet 0  . pantoprazole (PROTONIX) 40 MG tablet Take 1 tablet (40 mg total) by mouth daily. (Patient not taking: Reported on 11/01/2019) 90 tablet 0  . triamcinolone cream (KENALOG) 0.1 % APPLY TOPICALLY TO ITCHY SKIN TWICE DAILY UP TO 2 WEEKS A MONTH AS NEEDED.     No current facility-administered medications for this visit.     Review of Systems Full ROS  was asked and was negative except for the information on the HPI  Physical Exam Blood pressure 98/67, pulse 79, temperature (!) 97.2 F (36.2 C), temperature source Temporal, height 5\' 6"  (1.676 m), weight 204 lb 12.8 oz (92.9 kg), SpO2 98 %. CONSTITUTIONAL: NAD EYES: Pupils are equal, round, Sclera are non-icteric. EARS, NOSE, MOUTH AND THROAT: She is wearing a mask. Hearing is intact to voice. LYMPH NODES:  Lymph nodes in the neck are normal. RESPIRATORY:  Lungs are clear. There is normal respiratory effort, with equal breath sounds bilaterally, and without pathologic use of accessory muscles. CARDIOVASCULAR: Heart is regular without murmurs, gallops, or rubs. GI: The abdomen is  soft, mild diffuse soreness, no peritonitis , no Murphy.  There is previous laparoscopic scars there are no palpable masses. There is no hepatosplenomegaly. There are normal bowel sounds in all quadrants. GU: Rectal deferred.   MUSCULOSKELETAL: Normal muscle strength and tone. No cyanosis or edema.   SKIN: Turgor is good and there are no pathologic skin lesions or ulcers. NEUROLOGIC: Motor and sensation is grossly normal. Cranial nerves are grossly intact. PSYCH:  Oriented to person, place and time. Affect is normal.  Data Reviewed  I have personally reviewed the patient's imaging, laboratory findings and medical records.    Assessment/Plan 59 year old female  with biliary colic and crescendo symptoms. Discussed with the patient in detail about her disease process and my recommendations for cholecystectomy.  Do think that she is a good candidate for robotic approach.The risks, benefits, complications, treatment options, and expected outcomes were discussed with the patient. The possibilities of bleeding, recurrent infection, finding a normal gallbladder, perforation of viscus organs, damage to surrounding structures, bile leak, abscess formation, needing a drain placed, the need for additional procedures, reaction to medication, pulmonary aspiration,  failure to diagnose a condition, the possible need to convert to an open procedure, and creating a complication requiring transfusion or operation were discussed with the patient. The patient and/or family concurred with the proposed plan, giving informed consent.   A copy of this report was sent to the referring provider  Caroleen Hamman, MD FACS General Surgeon 11/01/2019, 11:49 AM

## 2019-11-07 ENCOUNTER — Encounter: Payer: Self-pay | Admitting: Surgery

## 2019-11-07 ENCOUNTER — Inpatient Hospital Stay: Admission: RE | Admit: 2019-11-07 | Payer: BC Managed Care – PPO | Source: Ambulatory Visit

## 2019-11-07 NOTE — Pre-Procedure Instructions (Signed)
Attempted to contact patient at 12:30 pm and again at 4:3o pm.  No answer and no voice message so I was unable to leave a message or complete a preop interview. Will send instructions with soap with patient when she arrives for Covid testing.

## 2019-11-07 NOTE — Patient Instructions (Signed)
INSTRUCTIONS FOR SURGERY     Your surgery is scheduled for:   Thursday, June 3RD     To find out your arrival time for the day of surgery,          please call [phone] between 1 pm and 3 pm on :  Wednesday, June 2ND     When you arrive for surgery, report to the SECOND FLOOR OF THE MEDICAL MALL.       Do NOT stop on the first floor to register.    REMEMBER: Instructions that are not followed completely may result in serious medical risk,  up to and including death, or upon the discretion of your surgeon and anesthesiologist,            your surgery may need to be rescheduled.  __X__ 1. Do not eat food after midnight the night before your procedure.                    No gum, candy, lozenger, tic tacs, tums or hard candies.                  ABSOLUTELY NOTHING SOLID IN YOUR MOUTH AFTER MIDNIGHT                    You may drink unlimited clear liquids up to 2 hours before you are scheduled to arrive for surgery.                   Do not drink anything within those 2 hours unless you need to take medicine, then take the                   smallest amount you need.  Clear liquids include:  water, apple juice without pulp,                   any flavor Gatorade, Black coffee, black tea.  Sugar may be added but no dairy/ honey /lemon.                        Broth and jello is not considered a clear liquid.  __x__  2. On the morning of surgery, please brush your teeth with toothpaste and water. You may rinse with                  mouthwash if you wish but DO NOT SWALLOW TOOTHPASTE OR MOUTHWASH  __X___3. NO alcohol for 24 hours before or after surgery.  __x___ 4.  Do NOT smoke or use e-cigarettes for 24 HOURS PRIOR TO SURGERY.                      DO NOT Use any chewable tobacco products for at least 6 hours prior to surgery.  __x___ 5. If you start any new medication after this appointment and prior to surgery, please    Bring it with you on the day of surgery.  ___x__ 6. Notify your doctor if there is any change in your medical condition, such as fever, infection, vomitting,  Diarrhea or any open sores.  __x___ 7.  USE the CHG SOAP as instructed, the night before surgery and the day of surgery.                   Once you have washed with this soap, do NOT use any of the following: Powders, perfumes                    or lotions. Please do not wear make up, hairpins, clips or nail polish. You MAY wear deodorant.                   Men may shave their face and neck.  Women need to shave 48 hours prior to surgery.                   DO NOT wear ANY jewelry on the day of surgery. If there are rings that are too tight to                    remove easily, please address this prior to the surgery day. Piercings need to be removed.                                                                     NO METAL ON YOUR BODY.                    Do NOT bring any valuables.  If you came to Pre-Admit testing then you will not need license,                     insurance card or credit card.  If you will be staying overnight, please either leave your things in                     the car or have your family be responsible for these items.                     [facility] IS NOT RESPONSIBLE FOR BELONGINGS OR VALUABLES.  ___X__ 8. DO NOT wear contact lenses on surgery day.  You may not have dentures,                     Hearing aides, contacts or glasses in the operating room. These items can be                    Placed in the Recovery Room to receive immediately after surgery.  __x___ 9. IF YOU ARE SCHEDULED TO GO HOME ON THE SAME DAY, YOU MUST                   Have someone to drive you home and to stay with you  for the first 24 hours.                    Have an arrangement prior to arriving on surgery day.  ___x__ 10. Take the following medications on the morning of surgery with a sip of water:  1. METOPROLOL                     2.PROTONIX                     3.LEXAPRO, if you take in the morning                     4.                              _____ 11.  Follow any instructions provided to you by your surgeon.                        Such as enema, clear liquid bowel prep  __X__  12. STOP all ASPIRIN PRODUCTS AS OF NOW.  11/07/19                       THIS INCLUDES BC POWDERS / GOODIES POWDER  __x___ 13. STOP Anti-inflammatories as of:  NOW.   11/07/19                      This includes IBUPROFEN / MOTRIN / ADVIL / ALEVE/ NAPROXYN                    YOU MAY TAKE TYLENOL ANY TIME PRIOR TO SURGERY.  ___X__ 14.  Stop supplements until after surgery.                     This includes: VITAMIN C // B COMPLEX // FLAX SEED // GLUCOSAMINE                         CHONDROINTIN // [place] // ZINC // MSM //VITAMIN B6                 You may continue taking Vitamin B12 / Vitamin D3 but do not take on the morning of surgery.  _X____17.  Continue to take the following medications but do not take on the morning of surgery:                         LOSARTAN // BARIATRIC VITAMINS   __X____18. If staying overnight, please have appropriate shoes to wear to be able to walk around the unit.                   Wear clean and comfortable clothing to the hospital.  PLEASE BRING ALL CONTACT PHONE NUMBERS WITH YOU.  WHOMEVER IS BRINGING YOU TO THE HOSPITAL MAY EITHER DROP YOU OFF AT [facility] OR MAY WAIT IN THE SURGICAL WAITING ROOM.  BOTH OF YOU MUST WEAR A MASK       AT ALL TIMES.

## 2019-11-08 ENCOUNTER — Other Ambulatory Visit
Admission: RE | Admit: 2019-11-08 | Discharge: 2019-11-08 | Disposition: A | Discharge status: Home / Self Care | Payer: BC Managed Care – PPO | Source: Ambulatory Visit | Attending: Surgery | Admitting: Surgery

## 2019-11-08 ENCOUNTER — Other Ambulatory Visit: Payer: Self-pay

## 2019-11-08 DIAGNOSIS — Z20822 Contact with and (suspected) exposure to covid-19: Secondary | ICD-10-CM | POA: Insufficient documentation

## 2019-11-08 DIAGNOSIS — Z01812 Encounter for preprocedural laboratory examination: Secondary | ICD-10-CM | POA: Diagnosis present

## 2019-11-08 LAB — SARS CORONAVIRUS 2 (TAT 6-24 HRS): SARS Coronavirus 2: NEGATIVE

## 2019-11-09 ENCOUNTER — Encounter
Admission: RE | Disposition: A | Discharge status: Home / Self Care | Payer: Self-pay | Source: Home / Self Care | Attending: Surgery

## 2019-11-09 ENCOUNTER — Ambulatory Visit: Payer: BC Managed Care – PPO | Admitting: Anesthesiology

## 2019-11-09 ENCOUNTER — Ambulatory Visit
Admission: RE | Admit: 2019-11-09 | Discharge: 2019-11-09 | Disposition: A | Discharge status: Home / Self Care | Payer: BC Managed Care – PPO | Attending: Surgery | Admitting: Surgery

## 2019-11-09 ENCOUNTER — Encounter: Payer: Self-pay | Admitting: Surgery

## 2019-11-09 DIAGNOSIS — G43909 Migraine, unspecified, not intractable, without status migrainosus: Secondary | ICD-10-CM | POA: Diagnosis not present

## 2019-11-09 DIAGNOSIS — I129 Hypertensive chronic kidney disease with stage 1 through stage 4 chronic kidney disease, or unspecified chronic kidney disease: Secondary | ICD-10-CM | POA: Insufficient documentation

## 2019-11-09 DIAGNOSIS — G2581 Restless legs syndrome: Secondary | ICD-10-CM | POA: Diagnosis not present

## 2019-11-09 DIAGNOSIS — K8062 Calculus of gallbladder and bile duct with acute cholecystitis without obstruction: Secondary | ICD-10-CM | POA: Diagnosis present

## 2019-11-09 DIAGNOSIS — Z79899 Other long term (current) drug therapy: Secondary | ICD-10-CM | POA: Diagnosis not present

## 2019-11-09 DIAGNOSIS — Z6833 Body mass index (BMI) 33.0-33.9, adult: Secondary | ICD-10-CM | POA: Diagnosis not present

## 2019-11-09 DIAGNOSIS — E559 Vitamin D deficiency, unspecified: Secondary | ICD-10-CM | POA: Diagnosis not present

## 2019-11-09 DIAGNOSIS — G4733 Obstructive sleep apnea (adult) (pediatric): Secondary | ICD-10-CM | POA: Insufficient documentation

## 2019-11-09 DIAGNOSIS — Z9884 Bariatric surgery status: Secondary | ICD-10-CM | POA: Insufficient documentation

## 2019-11-09 DIAGNOSIS — K811 Chronic cholecystitis: Secondary | ICD-10-CM

## 2019-11-09 DIAGNOSIS — N183 Chronic kidney disease, stage 3 unspecified: Secondary | ICD-10-CM | POA: Insufficient documentation

## 2019-11-09 DIAGNOSIS — F419 Anxiety disorder, unspecified: Secondary | ICD-10-CM | POA: Insufficient documentation

## 2019-11-09 DIAGNOSIS — K8020 Calculus of gallbladder without cholecystitis without obstruction: Secondary | ICD-10-CM

## 2019-11-09 DIAGNOSIS — E669 Obesity, unspecified: Secondary | ICD-10-CM | POA: Diagnosis not present

## 2019-11-09 DIAGNOSIS — K219 Gastro-esophageal reflux disease without esophagitis: Secondary | ICD-10-CM | POA: Diagnosis not present

## 2019-11-09 HISTORY — DX: Cardiac arrhythmia, unspecified: I49.9

## 2019-11-09 HISTORY — DX: Anemia, unspecified: D64.9

## 2019-11-09 SURGERY — CHOLECYSTECTOMY, ROBOT-ASSISTED, LAPAROSCOPIC
Anesthesia: General

## 2019-11-09 MED ORDER — EPHEDRINE 5 MG/ML INJ
INTRAVENOUS | Status: AC
  Filled 2019-11-09: qty 10

## 2019-11-09 MED ORDER — ROCURONIUM BROMIDE 100 MG/10ML IV SOLN
INTRAVENOUS | Status: DC | PRN
  Administered 2019-11-09: 50 mg via INTRAVENOUS

## 2019-11-09 MED ORDER — MIDAZOLAM HCL 2 MG/2ML IJ SOLN
INTRAMUSCULAR | Status: DC | PRN
  Administered 2019-11-09: 2 mg via INTRAVENOUS

## 2019-11-09 MED ORDER — CHLORHEXIDINE GLUCONATE CLOTH 2 % EX PADS: 6.0000 | MEDICATED_PAD | Freq: Once | CUTANEOUS | Status: DC

## 2019-11-09 MED ORDER — GABAPENTIN 300 MG PO CAPS
ORAL_CAPSULE | ORAL | Status: AC
  Administered 2019-11-09: 300 mg via ORAL
  Filled 2019-11-09: qty 1

## 2019-11-09 MED ORDER — CHLORHEXIDINE GLUCONATE 0.12 % MT SOLN
OROMUCOSAL | Status: AC
  Administered 2019-11-09: 15 mL via OROMUCOSAL
  Filled 2019-11-09: qty 15

## 2019-11-09 MED ORDER — FENTANYL CITRATE (PF) 100 MCG/2ML IJ SOLN
INTRAMUSCULAR | Status: DC | PRN
  Administered 2019-11-09: 25 ug via INTRAVENOUS
  Administered 2019-11-09: 50 ug via INTRAVENOUS
  Administered 2019-11-09: 25 ug via INTRAVENOUS
  Administered 2019-11-09: 50 ug via INTRAVENOUS

## 2019-11-09 MED ORDER — BUPIVACAINE HCL (PF) 0.25 % IJ SOLN
INTRAMUSCULAR | Status: AC
  Filled 2019-11-09: qty 30

## 2019-11-09 MED ORDER — INDOCYANINE GREEN 25 MG IV SOLR
7.5000 mg | Freq: Once | INTRAVENOUS | Status: AC
  Administered 2019-11-09: 7.5 mg via INTRAVENOUS
  Filled 2019-11-09: qty 10

## 2019-11-09 MED ORDER — EPHEDRINE SULFATE 50 MG/ML IJ SOLN
INTRAMUSCULAR | Status: DC | PRN
  Administered 2019-11-09 (×2): 10 mg via INTRAVENOUS

## 2019-11-09 MED ORDER — FENTANYL CITRATE (PF) 100 MCG/2ML IJ SOLN
INTRAMUSCULAR | Status: AC
  Administered 2019-11-09: 50 ug via INTRAVENOUS
  Filled 2019-11-09: qty 2

## 2019-11-09 MED ORDER — HYDROCODONE-ACETAMINOPHEN 5-325 MG PO TABS
1.0000 | ORAL_TABLET | Freq: Four times a day (QID) | ORAL | 0 refills | Status: DC | PRN
Start: 2019-11-09 — End: 2022-03-06

## 2019-11-09 MED ORDER — LIDOCAINE HCL (CARDIAC) PF 100 MG/5ML IV SOSY
PREFILLED_SYRINGE | INTRAVENOUS | Status: DC | PRN
  Administered 2019-11-09: 100 mg via INTRAVENOUS

## 2019-11-09 MED ORDER — CELECOXIB 200 MG PO CAPS: 200.0000 mg | ORAL_CAPSULE | ORAL | Status: AC

## 2019-11-09 MED ORDER — FENTANYL CITRATE (PF) 100 MCG/2ML IJ SOLN
INTRAMUSCULAR | Status: AC
  Filled 2019-11-09: qty 2

## 2019-11-09 MED ORDER — DEXMEDETOMIDINE HCL IN NACL 80 MCG/20ML IV SOLN
INTRAVENOUS | Status: AC
  Filled 2019-11-09: qty 20

## 2019-11-09 MED ORDER — CELECOXIB 200 MG PO CAPS
ORAL_CAPSULE | ORAL | Status: AC
  Administered 2019-11-09: 200 mg via ORAL
  Filled 2019-11-09: qty 1

## 2019-11-09 MED ORDER — BUPIVACAINE-EPINEPHRINE (PF) 0.25% -1:200000 IJ SOLN
INTRAMUSCULAR | Status: DC | PRN
  Administered 2019-11-09: 30 mL

## 2019-11-09 MED ORDER — GLYCOPYRROLATE 0.2 MG/ML IJ SOLN
INTRAMUSCULAR | Status: DC | PRN
Start: 2019-11-09 — End: 2019-11-09
  Administered 2019-11-09: .1 mg via INTRAVENOUS

## 2019-11-09 MED ORDER — MIDAZOLAM HCL 2 MG/2ML IJ SOLN
INTRAMUSCULAR | Status: AC
  Filled 2019-11-09: qty 2

## 2019-11-09 MED ORDER — CEFAZOLIN SODIUM-DEXTROSE 2-4 GM/100ML-% IV SOLN
INTRAVENOUS | Status: AC
  Filled 2019-11-09: qty 100

## 2019-11-09 MED ORDER — CHLORHEXIDINE GLUCONATE 0.12 % MT SOLN: 15.0000 mL | Freq: Once | OROMUCOSAL | Status: AC

## 2019-11-09 MED ORDER — CHLORHEXIDINE GLUCONATE CLOTH 2 % EX PADS
6.0000 | MEDICATED_PAD | Freq: Once | CUTANEOUS | Status: AC
  Administered 2019-11-09: 6 via TOPICAL

## 2019-11-09 MED ORDER — SCOPOLAMINE 1 MG/3DAYS TD PT72: 1.0000 | MEDICATED_PATCH | Freq: Once | TRANSDERMAL | Status: DC

## 2019-11-09 MED ORDER — ACETAMINOPHEN 500 MG PO TABS: 1000.0000 mg | ORAL_TABLET | ORAL | Status: AC

## 2019-11-09 MED ORDER — DEXMEDETOMIDINE HCL 200 MCG/2ML IV SOLN
INTRAVENOUS | Status: DC | PRN
  Administered 2019-11-09 (×3): 4 ug via INTRAVENOUS

## 2019-11-09 MED ORDER — PROMETHAZINE HCL 25 MG/ML IJ SOLN: 6.2500 mg | INTRAMUSCULAR | Status: DC | PRN

## 2019-11-09 MED ORDER — ORAL CARE MOUTH RINSE: 15.0000 mL | Freq: Once | OROMUCOSAL | Status: AC

## 2019-11-09 MED ORDER — ONDANSETRON HCL 4 MG/2ML IJ SOLN
INTRAMUSCULAR | Status: DC | PRN
  Administered 2019-11-09: 4 mg via INTRAVENOUS

## 2019-11-09 MED ORDER — FENTANYL CITRATE (PF) 100 MCG/2ML IJ SOLN
25.0000 ug | INTRAMUSCULAR | Status: DC | PRN
  Administered 2019-11-09: 25 ug via INTRAVENOUS

## 2019-11-09 MED ORDER — DEXAMETHASONE SODIUM PHOSPHATE 10 MG/ML IJ SOLN
INTRAMUSCULAR | Status: DC | PRN
  Administered 2019-11-09: 10 mg via INTRAVENOUS

## 2019-11-09 MED ORDER — LACTATED RINGERS IV SOLN: INTRAVENOUS | Status: DC

## 2019-11-09 MED ORDER — GABAPENTIN 300 MG PO CAPS: 300.0000 mg | ORAL_CAPSULE | ORAL | Status: AC

## 2019-11-09 MED ORDER — GLYCOPYRROLATE 0.2 MG/ML IJ SOLN
INTRAMUSCULAR | Status: AC
  Filled 2019-11-09: qty 1

## 2019-11-09 MED ORDER — SCOPOLAMINE 1 MG/3DAYS TD PT72
MEDICATED_PATCH | TRANSDERMAL | Status: AC
  Administered 2019-11-09: 1.5 mg via TRANSDERMAL
  Filled 2019-11-09: qty 1

## 2019-11-09 MED ORDER — SUGAMMADEX SODIUM 200 MG/2ML IV SOLN
INTRAVENOUS | Status: DC | PRN
  Administered 2019-11-09: 200 mg via INTRAVENOUS

## 2019-11-09 MED ORDER — EPINEPHRINE PF 1 MG/ML IJ SOLN
INTRAMUSCULAR | Status: AC
  Filled 2019-11-09: qty 1

## 2019-11-09 MED ORDER — PROPOFOL 10 MG/ML IV BOLUS
INTRAVENOUS | Status: DC | PRN
  Administered 2019-11-09: 150 mg via INTRAVENOUS
  Administered 2019-11-09: 50 mg via INTRAVENOUS

## 2019-11-09 MED ORDER — PROPOFOL 10 MG/ML IV BOLUS
INTRAVENOUS | Status: AC
  Filled 2019-11-09: qty 20

## 2019-11-09 MED ORDER — SODIUM CHLORIDE 0.9 % IV SOLN
INTRAVENOUS | Status: DC | PRN
  Administered 2019-11-09: 50 ug/min via INTRAVENOUS

## 2019-11-09 MED ORDER — CEFAZOLIN SODIUM-DEXTROSE 2-4 GM/100ML-% IV SOLN
2.0000 g | INTRAVENOUS | Status: AC
  Administered 2019-11-09: 2 g via INTRAVENOUS

## 2019-11-09 MED ORDER — ACETAMINOPHEN 500 MG PO TABS
ORAL_TABLET | ORAL | Status: AC
  Administered 2019-11-09: 1000 mg via ORAL
  Filled 2019-11-09: qty 2

## 2019-11-09 MED ORDER — PHENYLEPHRINE HCL (PRESSORS) 10 MG/ML IV SOLN
INTRAVENOUS | Status: DC | PRN
  Administered 2019-11-09 (×3): 100 ug via INTRAVENOUS

## 2019-11-09 SURGICAL SUPPLY — 47 items
CANISTER SUCT 1200ML W/VALVE (MISCELLANEOUS) ×3 IMPLANT
CANNULA REDUC XI 12-8 STAPL (CANNULA) ×1
CANNULA REDUCER 12-8 DVNC XI (CANNULA) ×2 IMPLANT
CHLORAPREP W/TINT 26 (MISCELLANEOUS) ×3 IMPLANT
CLIP VESOLOCK MED LG 6/CT (CLIP) ×3 IMPLANT
COVER WAND RF STERILE (DRAPES) ×3 IMPLANT
DECANTER SPIKE VIAL GLASS SM (MISCELLANEOUS) ×3 IMPLANT
DEFOGGER SCOPE WARMER CLEARIFY (MISCELLANEOUS) ×3 IMPLANT
DERMABOND ADVANCED (GAUZE/BANDAGES/DRESSINGS) ×1
DERMABOND ADVANCED .7 DNX12 (GAUZE/BANDAGES/DRESSINGS) ×2 IMPLANT
DRAPE ARM DVNC X/XI (DISPOSABLE) ×8 IMPLANT
DRAPE COLUMN DVNC XI (DISPOSABLE) ×2 IMPLANT
DRAPE DA VINCI XI ARM (DISPOSABLE) ×4
DRAPE DA VINCI XI COLUMN (DISPOSABLE) ×1
ELECT CAUTERY BLADE 6.4 (BLADE) ×3 IMPLANT
ELECT REM PT RETURN 9FT ADLT (ELECTROSURGICAL) ×3
ELECTRODE REM PT RTRN 9FT ADLT (ELECTROSURGICAL) ×2 IMPLANT
GLOVE BIO SURGEON STRL SZ7 (GLOVE) ×6 IMPLANT
GOWN STRL REUS W/ TWL LRG LVL3 (GOWN DISPOSABLE) ×8 IMPLANT
GOWN STRL REUS W/TWL LRG LVL3 (GOWN DISPOSABLE) ×4
IRRIGATION STRYKERFLOW (MISCELLANEOUS) IMPLANT
IRRIGATOR STRYKERFLOW (MISCELLANEOUS)
IV NS 1000ML (IV SOLUTION)
IV NS 1000ML BAXH (IV SOLUTION) IMPLANT
KIT PINK PAD W/HEAD ARE REST (MISCELLANEOUS) ×3
KIT PINK PAD W/HEAD ARM REST (MISCELLANEOUS) ×2 IMPLANT
LABEL OR SOLS (LABEL) ×3 IMPLANT
NEEDLE HYPO 22GX1.5 SAFETY (NEEDLE) ×3 IMPLANT
NS IRRIG 500ML POUR BTL (IV SOLUTION) ×3 IMPLANT
OBTURATOR OPTICAL STANDARD 8MM (TROCAR) ×1
OBTURATOR OPTICAL STND 8 DVNC (TROCAR) ×2
OBTURATOR OPTICALSTD 8 DVNC (TROCAR) ×2 IMPLANT
PACK LAP CHOLECYSTECTOMY (MISCELLANEOUS) ×3 IMPLANT
PENCIL ELECTRO HAND CTR (MISCELLANEOUS) ×3 IMPLANT
POUCH SPECIMEN RETRIEVAL 10MM (ENDOMECHANICALS) ×3 IMPLANT
SEAL CANN UNIV 5-8 DVNC XI (MISCELLANEOUS) ×6 IMPLANT
SEAL XI 5MM-8MM UNIVERSAL (MISCELLANEOUS) ×3
SET TUBE SMOKE EVAC HIGH FLOW (TUBING) ×3 IMPLANT
SOLUTION ELECTROLUBE (MISCELLANEOUS) ×3 IMPLANT
SPONGE LAP 18X18 RF (DISPOSABLE) ×3 IMPLANT
SPONGE LAP 4X18 RFD (DISPOSABLE) IMPLANT
STAPLER CANNULA SEAL DVNC XI (STAPLE) ×2 IMPLANT
STAPLER CANNULA SEAL XI (STAPLE) ×1
SUT MNCRL AB 4-0 PS2 18 (SUTURE) ×3 IMPLANT
SUT VICRYL 0 AB UR-6 (SUTURE) ×6 IMPLANT
TAPE TRANSPORE STRL 2 31045 (GAUZE/BANDAGES/DRESSINGS) ×3 IMPLANT
TROCAR 130MM GELPORT  DAV (MISCELLANEOUS) ×3 IMPLANT

## 2019-11-09 NOTE — Interval H&P Note (Signed)
History and Physical Interval Note:  11/09/2019 8:44 AM  Barbara Buckley  has presented today for surgery, with the diagnosis of Biliary Colie.  The various methods of treatment have been discussed with the patient and family. After consideration of risks, benefits and other options for treatment, the patient has consented to  Procedure(s): XI ROBOTIC ASSISTED LAPAROSCOPIC CHOLECYSTECTOMY (N/A) as a surgical intervention.  The patient's history has been reviewed, patient examined, no change in status, stable for surgery.  I have reviewed the patient's chart and labs.  Questions were answered to the patient's satisfaction.     Darlys Buis F Barbara Buckley

## 2019-11-09 NOTE — Transfer of Care (Signed)
Immediate Anesthesia Transfer of Care Note  Patient: Barbara Buckley  Procedure(s) Performed: XI ROBOTIC ASSISTED LAPAROSCOPIC CHOLECYSTECTOMY (N/A ) INDOCYANINE GREEN FLUORESCENCE IMAGING (ICG)  Patient Location: PACU  Anesthesia Type:General  Level of Consciousness: drowsy  Airway & Oxygen Therapy: Patient Spontanous Breathing and Patient connected to face mask oxygen  Post-op Assessment: Report given to RN and Post -op Vital signs reviewed and stable  Post vital signs: Reviewed and stable  Last Vitals:  Vitals Value Taken Time  BP 100/64 11/09/19 1115  Temp 36.1 C 11/09/19 1115  Pulse 74 11/09/19 1117  Resp 16 11/09/19 1117  SpO2 100 % 11/09/19 1117  Vitals shown include unvalidated device data.  Last Pain:  Vitals:   11/09/19 0847  TempSrc: Tympanic  PainSc: 0-No pain         Complications: No apparent anesthesia complications

## 2019-11-09 NOTE — Anesthesia Preprocedure Evaluation (Signed)
Anesthesia Evaluation  Patient identified by MRN, date of birth, ID band Patient awake    Reviewed: Allergy & Precautions, H&P , NPO status , Patient's Chart, lab work & pertinent test results, reviewed documented beta blocker date and time   History of Anesthesia Complications (+) PONV and history of anesthetic complications  Airway Mallampati: II  TM Distance: >3 FB Neck ROM: full    Dental  (+) Dental Advidsory Given, Caps, Teeth Intact   Pulmonary neg shortness of breath, sleep apnea , neg COPD, neg recent URI,    Pulmonary exam normal breath sounds clear to auscultation       Cardiovascular Exercise Tolerance: Good hypertension, (-) angina(-) Past MI and (-) Cardiac Stents + dysrhythmias Supra Ventricular Tachycardia (-) Valvular Problems/Murmurs Rhythm:regular Rate:Normal     Neuro/Psych PSYCHIATRIC DISORDERS Anxiety negative neurological ROS     GI/Hepatic negative GI ROS, Neg liver ROS,   Endo/Other  negative endocrine ROS  Renal/GU CRFRenal disease  negative genitourinary   Musculoskeletal   Abdominal   Peds  Hematology negative hematology ROS (+)   Anesthesia Other Findings Past Medical History: No date: Anemia     Comment:  vitamin d deficiency No date: Anxiety     Comment:  at night No date: CKD (chronic kidney disease), stage III 10/2019: Cystitis     Comment:  required Keflex No date: Dysrhythmia     Comment:  svt No date: GERD (gastroesophageal reflux disease)     Comment:  history of No date: History of cold sores No date: Hypertension No date: Lymphedema     Comment:  Bilateral lower extremity No date: Migraines     Comment:  history of No date: Obesity No date: OSA on CPAP     Comment:  uses a cpap No date: PONV (postoperative nausea and vomiting) No date: Restless leg syndrome No date: Skin rash     Comment:  due to stress No date: Tachycardia No date: Vitamin D deficiency   Reproductive/Obstetrics negative OB ROS                             Anesthesia Physical Anesthesia Plan  ASA: II  Anesthesia Plan: General   Post-op Pain Management:    Induction: Intravenous  PONV Risk Score and Plan: 4 or greater and Ondansetron, Dexamethasone, Midazolam, Scopolamine patch - Pre-op, Promethazine and Treatment may vary due to age or medical condition  Airway Management Planned: Oral ETT  Additional Equipment:   Intra-op Plan:   Post-operative Plan: Extubation in OR  Informed Consent: I have reviewed the patients History and Physical, chart, labs and discussed the procedure including the risks, benefits and alternatives for the proposed anesthesia with the patient or authorized representative who has indicated his/her understanding and acceptance.     Dental Advisory Given  Plan Discussed with: Anesthesiologist, CRNA and Surgeon  Anesthesia Plan Comments:         Anesthesia Quick Evaluation

## 2019-11-09 NOTE — Op Note (Signed)
Robotic assisted laparoscopic Cholecystectomy  Pre-operative Diagnosis: Acute on chronic cholecystitis  Post-operative Diagnosis: same  Procedure:  Robotic assisted laparoscopic Cholecystectomy  Surgeon: Sterling Big, MD FACS  Anesthesia: Gen. with endotracheal tube  Findings: Chronic moderate to severe Cholecystitis  No evidence of bile leaks or injuries  Estimated Blood Loss: 20 cc       Specimens: Gallbladder           Complications: none   Procedure Details  The patient was seen again in the Holding Room. The benefits, complications, treatment options, and expected outcomes were discussed with the patient. The risks of bleeding, infection, recurrence of symptoms, failure to resolve symptoms, bile duct damage, bile duct leak, retained common bile duct stone, bowel injury, any of which could require further surgery and/or ERCP, stent, or papillotomy were reviewed with the patient. The likelihood of improving the patient's symptoms with return to their baseline status is good.  The patient and/or family concurred with the proposed plan, giving informed consent.  The patient was taken to Operating Room, identified  and the procedure verified as Laparoscopic Cholecystectomy.  A Time Out was held and the above information confirmed.  Prior to the induction of general anesthesia, antibiotic prophylaxis was administered. VTE prophylaxis was in place. General endotracheal anesthesia was then administered and tolerated well. After the induction, the abdomen was prepped with Chloraprep and draped in the sterile fashion. The patient was positioned in the supine position.  Cut down technique was used to enter the abdominal cavity and a Hasson trochar was placed after two vicryl stitches were anchored to the fascia. Pneumoperitoneum was then created with CO2 and tolerated well without any adverse changes in the patient's vital signs.  Three 8-mm ports were placed under direct vision. All skin  incisions  were infiltrated with a local anesthetic agent before making the incision and placing the trocars.   The patient was positioned  in reverse Trendelenburg, robot was brought to the surgical field and docked in the standard fashion.  We made sure all the instrumentation was kept indirect view at all times and that there were no collision between the arms. I scrubbed out and went to the console.  The gallbladder was identified, the fundus grasped and retracted cephalad. Adhesions were lysed bluntly. The infundibulum was grasped and retracted laterally, exposing the peritoneum overlying the triangle of Calot. This was then divided and exposed in a blunt fashion. An extended critical view of the cystic duct and cystic artery was obtained.  The cystic duct was clearly identified and bluntly dissected.   Artery and duct were double clipped and divided. Using ICG cholangiography we visualized the cystic duct and noevidence of bile injuries observed. The gallbladder was taken from the gallbladder fossa in a retrograde fashion with the electrocautery.  Given degree of inflammation he wall was entered and some bile was pilled. We irrigated with saline until suction was clear. Hemostasis was achieved with the electrocautery. nspection of the right upper quadrant was performed. No bleeding, bile duct injury or leak, or bowel injury was noted. Robotic instruments and robotic arms were undocked in the standard fashion.  I scrubbed back in.  The gallbladder was removed and placed in an Endocatch bag.   Pneumoperitoneum was released.  The periumbilical port site was closed with interrumpted 0 Vicryl sutures. 4-0 subcuticular Monocryl was used to close the skin. Dermabond was  applied.  The patient was then extubated and brought to the recovery room in stable condition. Sponge, lap,  and needle counts were correct at closure and at the conclusion of the case.               Caroleen Hamman, MD, FACS

## 2019-11-09 NOTE — Anesthesia Procedure Notes (Signed)
Procedure Name: Intubation Date/Time: 11/09/2019 9:57 AM Performed by: Joanette Gula, Summer, RN Pre-anesthesia Checklist: Patient identified, Emergency Drugs available, Suction available and Patient being monitored Patient Re-evaluated:Patient Re-evaluated prior to induction Oxygen Delivery Method: Circle system utilized Preoxygenation: Pre-oxygenation with 100% oxygen Induction Type: IV induction Ventilation: Mask ventilation without difficulty Laryngoscope Size: McGraph and 3 Grade View: Grade I Tube type: Oral Number of attempts: 2 Airway Equipment and Method: Stylet Placement Confirmation: ETT inserted through vocal cords under direct vision,  positive ETCO2 and breath sounds checked- equal and bilateral Secured at: 20 cm Tube secured with: Tape Dental Injury: Teeth and Oropharynx as per pre-operative assessment

## 2019-11-09 NOTE — Discharge Instructions (Addendum)
AMBULATORY SURGERY  DISCHARGE INSTRUCTIONS   1) The drugs that you were given will stay in your system until tomorrow so for the next 24 hours you should not:  A) Drive an automobile B) Make any legal decisions C) Drink any alcoholic beverage   2) You may resume regular meals tomorrow.  Today it is better to start with liquids and gradually work up to solid foods.  You may eat anything you prefer, but it is better to start with liquids, then soup and crackers, and gradually work up to solid foods.   3) Please notify your doctor immediately if you have any unusual bleeding, trouble breathing, redness and pain at the surgery site, drainage, fever, or pain not relieved by medication. 4)   5) Your post-operative visit with Dr.                                     is: Date:                        Time:    Please call to schedule your post-operative visit.  6) Additional Instructions:     Laparoscopic Cholecystectomy, Care After   These instructions give you information on caring for yourself after your procedure. Your doctor may also give you more specific instructions. Call your doctor if you have any problems or questions after your procedure.  HOME CARE  Change your bandages (dressings) as told by your doctor.  Keep the wound dry and clean. Wash the wound gently with soap and water. Pat the wound dry with a clean towel.  Do not take baths, swim, or use hot tubs for 2 weeks, or as told by your doctor.  Only take medicine as told by your doctor.  Eat a normal diet as told by your doctor.  Do not lift anything heavier than 10 pounds (4.5 kg) until your doctor says it is okay.  Do not play contact sports for 1 week, or as told by your doctor. GET HELP IF:  Your wound is red, puffy (swollen), or painful.  You have yellowish-white fluid (pus) coming from the wound.  You have fluid draining from the wound for more than 1 day.  You have a bad smell coming from the wound.  Your  wound breaks open. GET HELP RIGHT AWAY IF:  You have trouble breathing.  You have chest pain.  You have a fever >101  You have pain in the shoulders (shoulder strap areas) that is getting worse.  You feel dizzy or pass out (faint).  You have severe belly (abdominal) pain.  You feel sick to your stomach (nauseous) or throw up (vomit) for more than 1 day.   

## 2019-11-10 NOTE — Anesthesia Postprocedure Evaluation (Signed)
Anesthesia Post Note  Patient: Barbara Buckley  Procedure(s) Performed: XI ROBOTIC ASSISTED LAPAROSCOPIC CHOLECYSTECTOMY (N/A ) INDOCYANINE GREEN FLUORESCENCE IMAGING (ICG)  Patient location during evaluation: PACU Anesthesia Type: General Level of consciousness: awake and alert Pain management: pain level controlled Vital Signs Assessment: post-procedure vital signs reviewed and stable Respiratory status: spontaneous breathing, nonlabored ventilation, respiratory function stable and patient connected to nasal cannula oxygen Cardiovascular status: blood pressure returned to baseline and stable Postop Assessment: no apparent nausea or vomiting Anesthetic complications: no     Last Vitals:  Vitals:   11/09/19 1301 11/09/19 1314  BP:  117/70  Pulse:  73  Resp:  14  Temp: (!) 36.4 C (!) 36.2 C  SpO2:  100%    Last Pain:  Vitals:   11/10/19 0815  TempSrc:   PainSc: 2                  Lenard Simmer

## 2019-11-13 LAB — SURGICAL PATHOLOGY

## 2019-11-14 ENCOUNTER — Telehealth: Payer: Self-pay | Admitting: Surgery

## 2019-11-14 NOTE — Telephone Encounter (Signed)
Patient is calling and said she had surgery last Thursday, patient said she was having some pain, pain level is at a 3 to a 4, said she hurts when she coughs and  Breathe and is asking. Please call patient and advise.

## 2019-11-14 NOTE — Telephone Encounter (Signed)
Per Dr.Pabon recommends patient to continue to monitor the area of concern and was informed to give our office a call if she notices the symptoms worsen or she feels worse. Patient verbalizes understanding and has no further questions.

## 2019-11-22 ENCOUNTER — Telehealth: Payer: BC Managed Care – PPO | Admitting: Surgery

## 2019-11-27 ENCOUNTER — Other Ambulatory Visit: Payer: Self-pay

## 2019-11-27 ENCOUNTER — Telehealth (INDEPENDENT_AMBULATORY_CARE_PROVIDER_SITE_OTHER): Payer: Self-pay | Admitting: Surgery

## 2019-11-27 DIAGNOSIS — Z09 Encounter for follow-up examination after completed treatment for conditions other than malignant neoplasm: Secondary | ICD-10-CM

## 2019-11-28 ENCOUNTER — Encounter: Payer: Self-pay | Admitting: Surgery

## 2019-11-28 NOTE — Progress Notes (Signed)
I called the patient and her cell phone.  Status post robotic cholecystectomy.  She is doing very well.  No fevers no chills pain is very well controlled.  No complications.  Pathology discussed with patient in detail.  She will follow up on a as needed basis.

## 2019-12-08 ENCOUNTER — Telehealth: Payer: Self-pay | Admitting: *Deleted

## 2019-12-08 NOTE — Telephone Encounter (Signed)
Patient called and stated that she had surgery on 11/09/19 by Dr Everlene Farrier gallbladder and she noticed that on her incision it has opened up a little bit and she is afraid it may get infected. She stated that its a little red but no drainage. Please call and advise

## 2019-12-08 NOTE — Telephone Encounter (Signed)
Spoke with patient to gather more information pertaining to her call. Patient states she notices the area of concern to be a little red around the area and she has a little drainage but light in color. Patient denies having any fever, nausea, vomiting or chills. Patient states she has been cleaning the area with warm salt water, using ointment around the edges. Patient was informed that if she notices her symptoms worsen or area getting worse to give our office a call or go to her local urgent care or ER dept. Patient verbalizes understanding and has no further questions.

## 2020-06-13 ENCOUNTER — Other Ambulatory Visit: Payer: Self-pay | Admitting: Obstetrics & Gynecology

## 2020-06-13 DIAGNOSIS — Z1231 Encounter for screening mammogram for malignant neoplasm of breast: Secondary | ICD-10-CM

## 2020-07-05 ENCOUNTER — Ambulatory Visit
Admission: RE | Admit: 2020-07-05 | Discharge: 2020-07-05 | Disposition: A | Discharge status: Home / Self Care | Payer: BC Managed Care – PPO | Source: Ambulatory Visit | Attending: Obstetrics & Gynecology | Admitting: Obstetrics & Gynecology

## 2020-07-05 ENCOUNTER — Other Ambulatory Visit: Payer: Self-pay

## 2020-07-05 DIAGNOSIS — Z1231 Encounter for screening mammogram for malignant neoplasm of breast: Secondary | ICD-10-CM | POA: Insufficient documentation

## 2020-11-12 IMAGING — RF DG UGI W/ HIGH DENSITY W/O KUB
13 series · 14 of 14 positions shown · non-contrast
Comparison: No recent prior.

CLINICAL DATA: Preoperative exam for gastric surgery.

EXAM:
UPPER GI SERIES WITH KUB
TECHNIQUE: After obtaining a scout radiograph a routine upper GI series was
performed using thin and high density barium.
FLUOROSCOPY TIME:  Fluoroscopy Time:  Thin density barium
Radiation Exposure Index (if provided by the fluoroscopic device): 0
minutes 54 seconds
Number of Acquired Spot Images: 14

[Series 1: t abdomen supine · 0.15mm/px · 1 of 1 slices shown (1 of 2)]
[im 1/1]
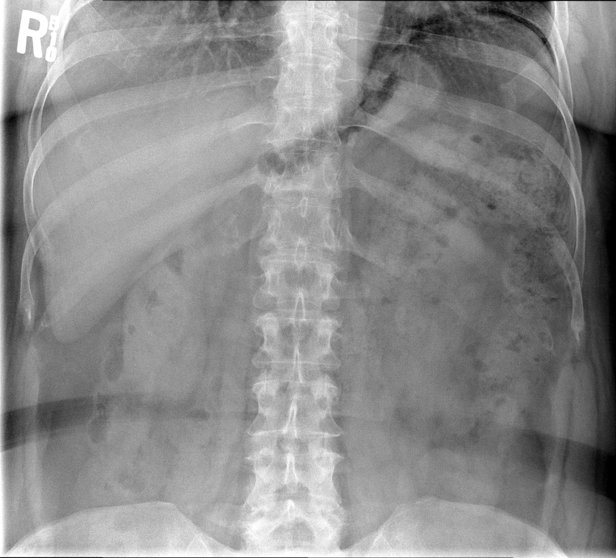

[Series 2: t abdomen supine · 0.15mm/px · 1 of 1 slices shown (2 of 2)]
[im 1/1]
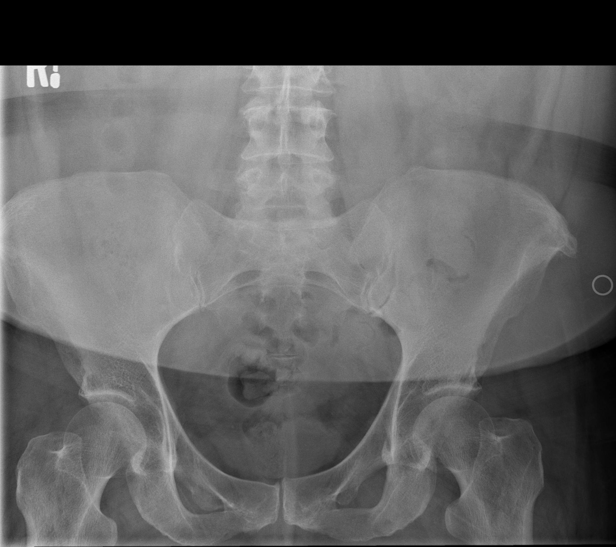

[Series 3: fluoro_barium 2fps_bw · 0.17mm/px · 1 of 1 slices shown (1 of 11)]
[im 1/1]
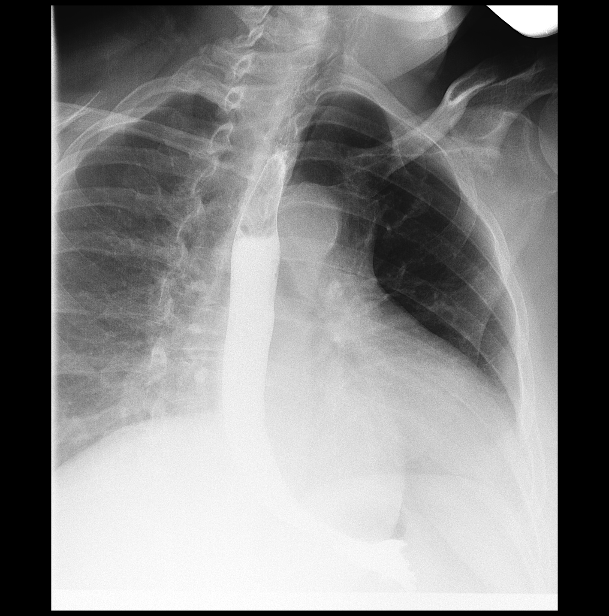

[Series 4: fluoro_barium 2fps_bw · 0.17mm/px · 1 of 1 slices shown (2 of 11)]
[im 1/1]
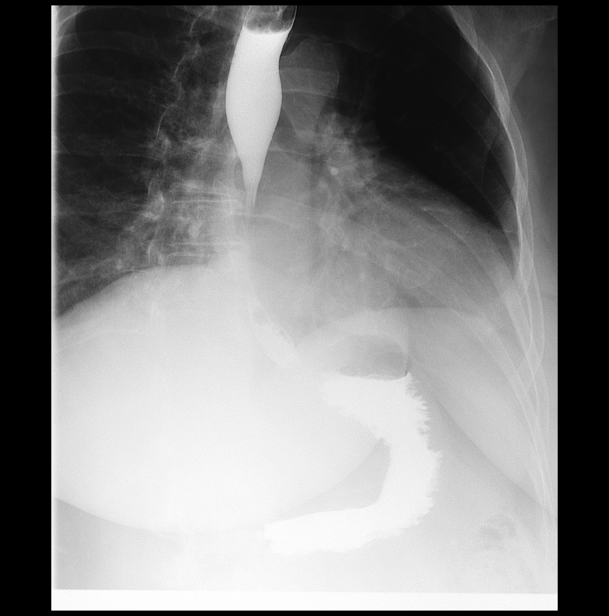

[Series 5: fluoro_barium 2fps_bw · 0.17mm/px · 1 of 1 slices shown (3 of 11)]
[im 1/1]
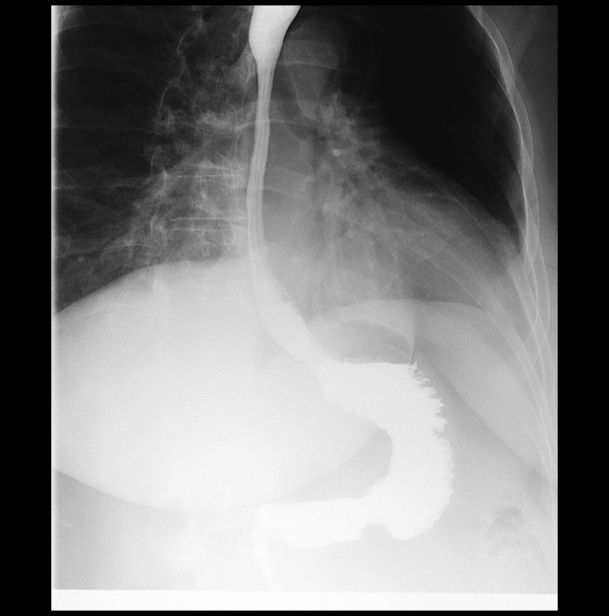

[Series 6: fluoro_barium 2fps_bw · 0.17mm/px · 1 of 1 slices shown (4 of 11)]
[im 1/1]
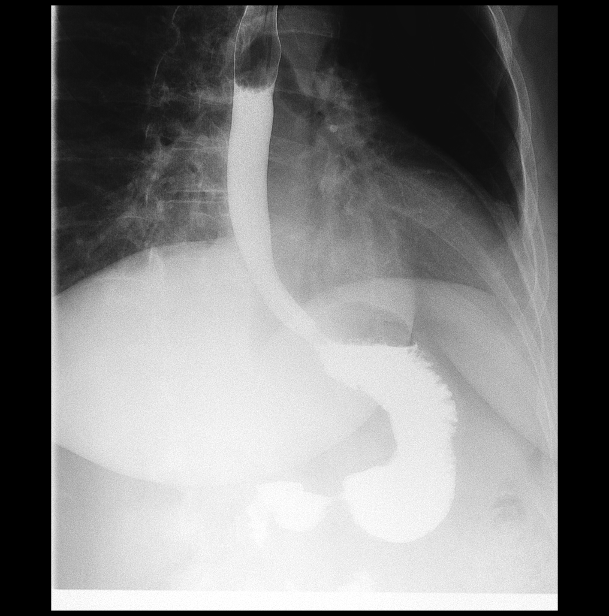

[Series 7: fluoro_barium 2fps_bw · 0.19mm/px · 1 of 1 slices shown (5 of 11)]
[im 1/1]
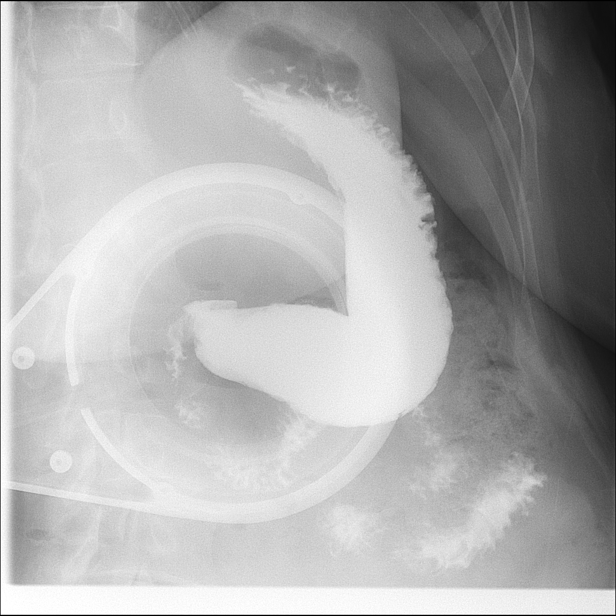

[Series 8: fluoro_barium 2fps_bw · 0.19mm/px · 1 of 1 slices shown (6 of 11)]
[im 1/1]
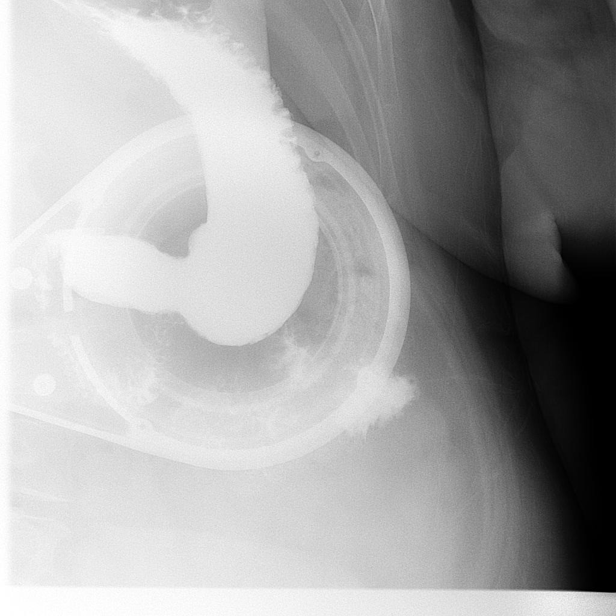

[Series 9: fluoro_barium 2fps_bw · 0.19mm/px · 2 of 2 frames shown (7 of 11)]
[frame 1/2]
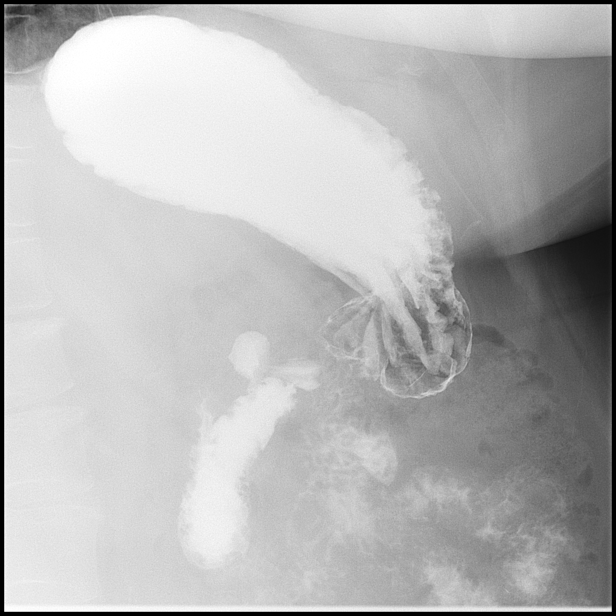
[frame 2/2]
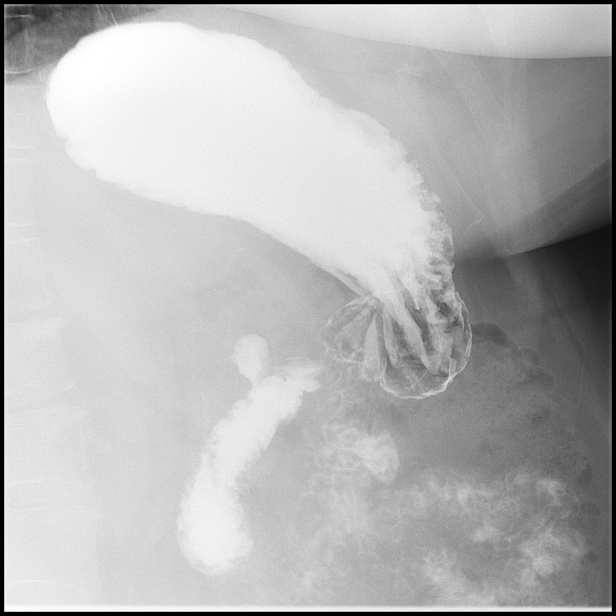

[Series 10: fluoro_barium 2fps_bw · 0.19mm/px · 1 of 1 slices shown (8 of 11)]
[im 1/1]
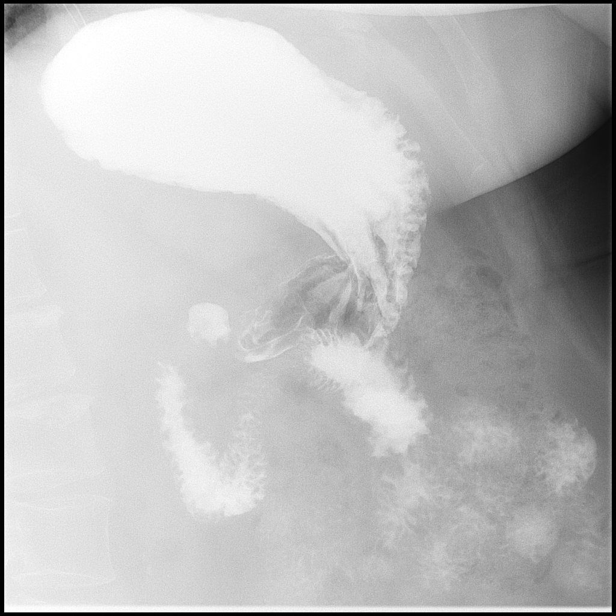

[Series 11: fluoro_barium 2fps_bw · 0.19mm/px · 1 of 1 slices shown (9 of 11)]
[im 1/1]
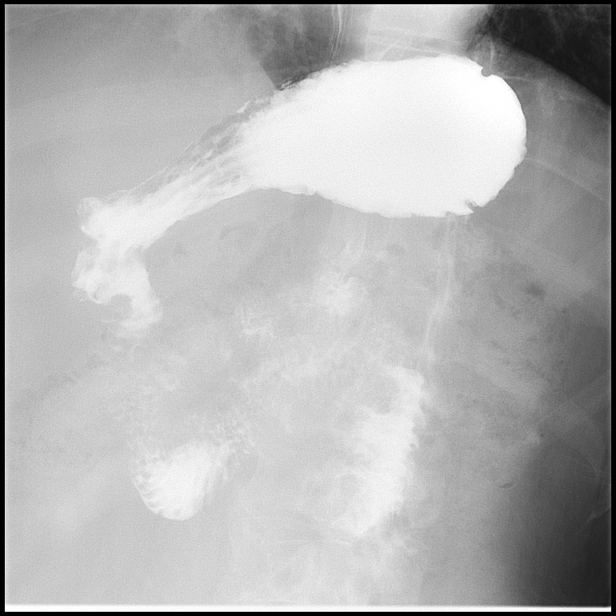

[Series 12: fluoro_barium 2fps_bw · 0.19mm/px · 1 of 1 slices shown (10 of 11)]
[im 1/1]
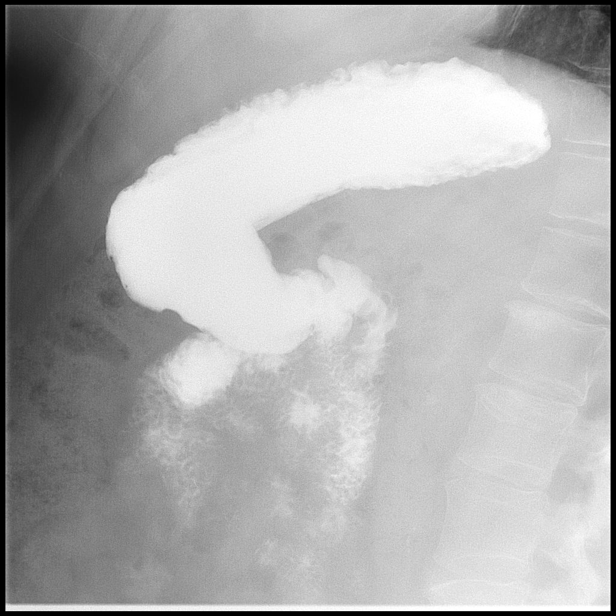

[Series 13: fluoro_barium 2fps_bw · 0.19mm/px · 1 of 1 slices shown (11 of 11)]
[im 1/1]
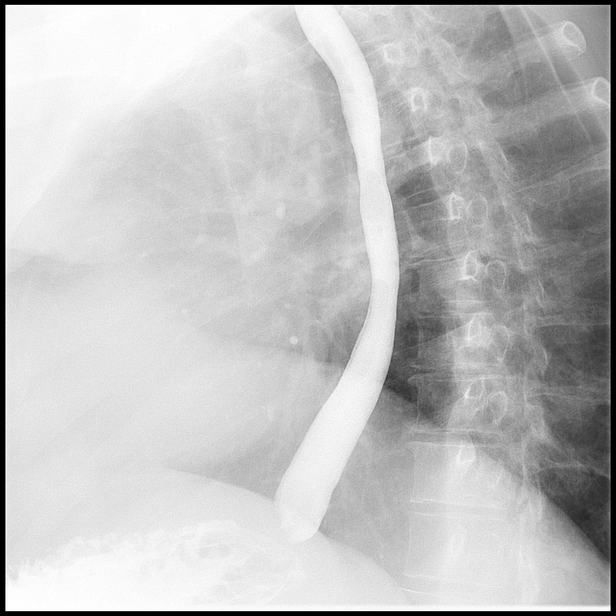

[14 of 14 positions shown; findings below may reference images not displayed]

FINDINGS: Faint calcific densities in the right upper quadrant. Gallstones can
not be excluded. Esophagus is widely patent. No evidence of reflux.
Stomach appears normal. Duodenal bulb and C-loop appear normal. No
evidence of ulceration.
IMPRESSION: 1. Faint calcific densities right upper quadrant. Gallstones can not
be excluded.

2. Esophagus, stomach, and duodenum appear normal. No reflux or
ulceration.

## 2021-08-07 ENCOUNTER — Encounter (HOSPITAL_COMMUNITY): Payer: Self-pay | Admitting: *Deleted

## 2021-09-03 ENCOUNTER — Ambulatory Visit (INDEPENDENT_AMBULATORY_CARE_PROVIDER_SITE_OTHER): Payer: Managed Care, Other (non HMO) | Admitting: Internal Medicine

## 2021-09-03 ENCOUNTER — Encounter: Payer: Self-pay | Admitting: Internal Medicine

## 2021-09-03 VITALS — BP 132/76 | HR 81 | Temp 98.0°F | Ht 66.0 in | Wt 203.0 lb

## 2021-09-03 DIAGNOSIS — N1831 Chronic kidney disease, stage 3a: Secondary | ICD-10-CM | POA: Diagnosis not present

## 2021-09-03 DIAGNOSIS — Z1211 Encounter for screening for malignant neoplasm of colon: Secondary | ICD-10-CM

## 2021-09-03 DIAGNOSIS — I10 Essential (primary) hypertension: Secondary | ICD-10-CM

## 2021-09-03 DIAGNOSIS — Z23 Encounter for immunization: Secondary | ICD-10-CM | POA: Diagnosis not present

## 2021-09-03 DIAGNOSIS — M25561 Pain in right knee: Secondary | ICD-10-CM

## 2021-09-03 DIAGNOSIS — Z1231 Encounter for screening mammogram for malignant neoplasm of breast: Secondary | ICD-10-CM

## 2021-09-03 DIAGNOSIS — R5383 Other fatigue: Secondary | ICD-10-CM

## 2021-09-03 DIAGNOSIS — Z9884 Bariatric surgery status: Secondary | ICD-10-CM | POA: Diagnosis not present

## 2021-09-03 DIAGNOSIS — Z0000 Encounter for general adult medical examination without abnormal findings: Secondary | ICD-10-CM

## 2021-09-03 DIAGNOSIS — R7301 Impaired fasting glucose: Secondary | ICD-10-CM

## 2021-09-03 DIAGNOSIS — M791 Myalgia, unspecified site: Secondary | ICD-10-CM

## 2021-09-03 DIAGNOSIS — E785 Hyperlipidemia, unspecified: Secondary | ICD-10-CM

## 2021-09-03 DIAGNOSIS — E559 Vitamin D deficiency, unspecified: Secondary | ICD-10-CM

## 2021-09-03 MED ORDER — ZOSTER VAC RECOMB ADJUVANTED 50 MCG/0.5ML IM SUSR: 0.5000 mL | Freq: Once | INTRAMUSCULAR | 1 refills | Status: DC

## 2021-09-03 NOTE — Patient Instructions (Addendum)
Welcome! ? ? ?Your annual mammogram has been ordered.  You are encouraged (required) to call to make your appointment at [facility]  [phone]  ? ?Referral for colonoscopy is in process ? ? ?Tdap and Pneumovax vaccines were given to you today ? ?

## 2021-09-03 NOTE — Progress Notes (Signed)
? ?Subjective:  ?Patient ID: Barbara Buckley, female    DOB: 07-28-1960  Age: 61 y.o. MRN: [id number] ? ?CC: The primary encounter diagnosis was Encounter for preventive health examination. Diagnoses of Encounter for screening mammogram for malignant neoplasm of breast, Colon cancer screening, Vitamin D deficiency, Other fatigue, Hyperlipidemia LDL goal <130, Impaired fasting glucose, Stage 3a chronic kidney disease ([place]), Arthralgia of right knee, Need for 23-polyvalent pneumococcal polysaccharide vaccine, Need for tetanus, diphtheria, and acellular pertussis (Tdap) vaccine, Essential hypertension, S/P laparoscopic sleeve gastrectomyAugust 2020, and Myalgia were also pertinent to this visit. ? ?HPI ?Barbara Buckley presents for establishment of care . Previous PCP was GYN [facility]  ? ? ?1) CKD stage 3 :  uses NSAIDs often for knee pain and headaches.  Aware of the risks .   ? ?2) Positive ANA: repeatedly,  referred to Rheumatology :for evaluation ,  told she did not have an autoimmune inflammatory arthrtis  ? ?3) Obesity:  s/p gastric sleeve  for wt of 300 lbs. By Kaylyn Lim in 2020  followed by cholecystectomy following year.  ? ?4) muscle and soft tissue pain recurrent episodes .  Aggravated by refined sugar ingestion  ? ?5) chronically constipated  averages ; 2 BMS per week , last colonoscopy unknown.  Denies pain , rectal bleeding  ? ? ? ?History ?Zivah has a past medical history of Anemia, Anxiety, CKD (chronic kidney disease), stage III ([place]), Cystitis (10/2019), Dysrhythmia, GERD (gastroesophageal reflux disease), History of cold sores, Hypertension, Lymphedema, Migraines, Obesity, OSA on CPAP, PONV (postoperative nausea and vomiting), Restless leg syndrome, Skin rash, Tachycardia, and Vitamin D deficiency.  ? ?She has a past surgical history that includes Bladder suspension; Foot surgery (Right); Cesarean section; Wisdom tooth extraction; Tonsillectomy and adenoidectomy;  Colonoscopy; Upper gi endoscopy; Cardiac catheterization; and Laparoscopic gastric sleeve resection (N/A, 01/09/2019).  ? ?Her family history includes Alcohol abuse in her maternal grandfather; Breast cancer in her paternal aunt; Drug abuse in her brother; Hyperlipidemia in her mother; Hypertension in her mother; Ovarian cancer in her maternal grandmother.She reports that she has never smoked. She has never used smokeless tobacco. She reports that she does not currently use alcohol. She reports that she does not use drugs. ? ?Outpatient Medications Prior to Visit  ?Medication Sig Dispense Refill  ? losartan (COZAAR) 100 MG tablet Take 100 mg by mouth daily.    ? metoprolol succinate (TOPROL-XL) 25 MG 24 hr tablet Take 25 mg by mouth daily.    ? Multiple Vitamins-Minerals (BARIATRIC MULTIVITAMINS/IRON PO) Take 1 tablet by mouth daily.    ? acetaminophen (TYLENOL) 500 MG tablet Take 1,000 mg by mouth every 6 (six) hours as needed for moderate pain or headache. (Patient not taking: Reported on 09/03/2021)    ? Ascorbic Acid (VITAMIN C) 1000 MG tablet Take 2,000 mg by mouth daily. (Patient not taking: Reported on 09/03/2021)    ? Brimonidine Tartrate (LUMIFY) 0.025 % SOLN Place 1 drop into both eyes daily. (Patient not taking: Reported on 09/03/2021)    ? cetirizine (ZYRTEC) 10 MG tablet Take 10 mg by mouth daily as needed for allergies. (Patient not taking: Reported on 09/03/2021)    ? cholecalciferol (VITAMIN D3) 25 MCG (1000 UNIT) tablet Take 1,000 Units by mouth daily. (Patient not taking: Reported on 09/03/2021)    ? Flaxseed, Linseed, (FLAX SEED OIL PO) Take 1,200 mg by mouth daily. (Patient not taking: Reported on 09/03/2021)    ? HYDROcodone-acetaminophen (NORCO/VICODIN) 5-325 MG tablet  Take 1-2 tablets by mouth every 6 (six) hours as needed for moderate pain. (Patient not taking: Reported on 09/03/2021) 20 tablet 0  ? ibuprofen (ADVIL) 200 MG tablet Take 800 mg by mouth every 6 (six) hours as needed for headache or  moderate pain. (Patient not taking: Reported on 09/03/2021)    ? Magnesium 250 MG TABS Take 250 mg by mouth daily. (Patient not taking: Reported on 09/03/2021)    ? Methylsulfonylmethane (MSM) 1000 MG CAPS Take 3,000 mg by mouth daily.  (Patient not taking: Reported on 09/03/2021)    ? ondansetron (ZOFRAN ODT) 4 MG disintegrating tablet Take 1 tablet (4 mg total) by mouth every 8 (eight) hours as needed. (Patient not taking: Reported on 09/03/2021) 20 tablet 0  ? OVER THE COUNTER MEDICATION Take 500 mg by mouth daily. Gaba otc supplement (Patient not taking: Reported on 09/03/2021)    ? pantoprazole (PROTONIX) 40 MG tablet Take 1 tablet (40 mg total) by mouth daily. (Patient not taking: Reported on 11/01/2019) 90 tablet 0  ? vitamin B-12 (CYANOCOBALAMIN) 1000 MCG tablet Take 1,000 mcg by mouth daily. (Patient not taking: Reported on 09/03/2021)    ? zinc gluconate 50 MG tablet Take 50 mg by mouth daily. (Patient not taking: Reported on 09/03/2021)    ? ?No facility-administered medications prior to visit.  ? ? ?Review of Systems: ? ?Patient denies headache, fevers, malaise, unintentional weight loss, skin rash, eye pain, sinus congestion and sinus pain, sore throat, dysphagia,  hemoptysis , cough, dyspnea, wheezing, chest pain, palpitations, orthopnea, edema, abdominal pain, nausea, melena, diarrhea, constipation, flank pain, dysuria, hematuria, urinary  Frequency, nocturia, numbness, tingling, seizures,  Focal weakness, Loss of consciousness,  Tremor, insomnia, depression, anxiety, and suicidal ideation.   ? ? ?Objective:  ?BP 132/76 (BP Location: Left Arm, Patient Position: Sitting, Cuff Size: Small)   Pulse 81   Temp 98 ?F (36.7 ?C) (Oral)   Ht _0  (1.676 m)   Wt 203 lb (92.1 kg)   SpO2 97%   BMI 32.77 kg/m?  ? ?Physical Exam: ? ? ?General appearance: alert, cooperative and appears stated age ?Ears: normal TM's and external ear canals both ears ?Throat: lips, mucosa, and tongue normal; teeth and gums  normal ?Neck: no adenopathy, no carotid bruit, supple, symmetrical, trachea midline and thyroid not enlarged, symmetric, no tenderness/mass/nodules ?Back: symmetric, no curvature. ROM normal. No CVA tenderness. ?Lungs: clear to auscultation bilaterally ?Heart: regular rate and rhythm, S1, S2 normal, no murmur, click, rub or gallop ?Abdomen: soft, non-tender; bowel sounds normal; no masses,  no organomegaly ?Pulses: 2+ and symmetric ?Skin: Skin color, texture, turgor normal. No rashes or lesions ?Lymph nodes: Cervical, supraclavicular, and axillary nodes normal.  ? ?Assessment & Plan:  ? ?Problem List Items Addressed This Visit   ? ? RESOLVED: Stage 3a chronic kidney disease ([place])  ? Relevant Orders  ? PTH, Intact and Calcium (Completed)  ? CBC with Differential/Platelet (Completed)  ? S/P laparoscopic sleeve gastrectomyAugust 2020  ?  Done in 2020.  Preoperative  Weight was 260 lbs.  ?  ?  ? Myalgia  ?  Explained the lack of specifity  Of an ANA .  She has had rheumatologic evaluation and no signs of autoimmune conditions despite ANA positive.  ESR is normal ? ?Lab Results  ?Component Value Date  ? ESRSEDRATE 17 09/03/2021  ? ? ?  ?  ? Essential hypertension  ?  Well controlled on current regimen of metoprolol 25  mg daily and losartan  100 mg  . Renal function stable, no changes today. ?  ?  ? Encounter for preventive health examination - Primary  ?  age appropriate education and counseling updated, referrals for preventative services and immunizations addressed, dietary and smoking counseling addressed, most recent labs reviewed.  I have personally reviewed and have noted: ?  ?1) the patient's medical and social history ?2) The pt's use of alcohol, tobacco, and illicit drugs ?3) The patient's current medications and supplements ?4) Functional ability including ADL's, fall risk, home safety risk, hearing and visual impairment ?5) Diet and physical activities ?6) Evidence for depression or mood disorder ?7) The  patient's height, weight, and BMI have been recorded in the chart  ?I have made referrals, and provided counseling and education based on review of the above ?  ?  ? ?Other Visit Diagnoses   ? ? Encounter for screening mammogram for

## 2021-09-04 ENCOUNTER — Other Ambulatory Visit: Payer: Self-pay

## 2021-09-04 DIAGNOSIS — M791 Myalgia, unspecified site: Secondary | ICD-10-CM | POA: Insufficient documentation

## 2021-09-04 DIAGNOSIS — Z0000 Encounter for general adult medical examination without abnormal findings: Secondary | ICD-10-CM | POA: Insufficient documentation

## 2021-09-04 DIAGNOSIS — Z0001 Encounter for general adult medical examination with abnormal findings: Secondary | ICD-10-CM | POA: Insufficient documentation

## 2021-09-04 DIAGNOSIS — Z1211 Encounter for screening for malignant neoplasm of colon: Secondary | ICD-10-CM

## 2021-09-04 DIAGNOSIS — N1831 Chronic kidney disease, stage 3a: Secondary | ICD-10-CM | POA: Insufficient documentation

## 2021-09-04 LAB — COMPREHENSIVE METABOLIC PANEL
ALT: 18 U/L (ref 0–35)
AST: 20 U/L (ref 0–37)
Albumin: 4.5 g/dL (ref 3.5–5.2)
Alkaline Phosphatase: 71 U/L (ref 39–117)
BUN: 16 mg/dL (ref 6–23)
CO2: 25 mEq/L (ref 19–32)
Calcium: 9.8 mg/dL (ref 8.4–10.5)
Chloride: 104 mEq/L (ref 96–112)
Creatinine, Ser: 0.85 mg/dL (ref 0.40–1.20)
GFR: 74.64 mL/min (ref >60.00)
Glucose, Bld: 83 mg/dL (ref 70–99)
Potassium: 4.3 mEq/L (ref 3.5–5.1)
Sodium: 138 mEq/L (ref 135–145)
Total Bilirubin: 0.4 mg/dL (ref 0.2–1.2)
Total Protein: 7.2 g/dL (ref 6.0–8.3)

## 2021-09-04 LAB — CBC WITH DIFFERENTIAL/PLATELET
Basophils Absolute: 0 10*3/uL (ref 0.0–0.1)
Basophils Relative: 0.9 % (ref 0.0–3.0)
Eosinophils Absolute: 0.1 10*3/uL (ref 0.0–0.7)
Eosinophils Relative: 2.1 % (ref 0.0–5.0)
HCT: 40.9 % (ref 36.0–46.0)
Hemoglobin: 14 g/dL (ref 12.0–15.0)
Lymphocytes Relative: 38 % (ref 12.0–46.0)
Lymphs Abs: 1.9 10*3/uL (ref 0.7–4.0)
MCHC: 34.2 g/dL (ref 30.0–36.0)
MCV: 88.3 fl (ref 78.0–100.0)
Monocytes Absolute: 0.4 10*3/uL (ref 0.1–1.0)
Monocytes Relative: 8.3 % (ref 3.0–12.0)
Neutro Abs: 2.5 10*3/uL (ref 1.4–7.7)
Neutrophils Relative %: 50.7 % (ref 43.0–77.0)
Platelets: 203 10*3/uL (ref 150.0–400.0)
RBC: 4.63 Mil/uL (ref 3.87–5.11)
RDW: 13.4 % (ref 11.5–15.5)
WBC: 4.9 10*3/uL (ref 4.0–10.5)

## 2021-09-04 LAB — PTH, INTACT AND CALCIUM
Calcium: 9.6 mg/dL (ref 8.6–10.4)
PTH: 40 pg/mL (ref 16–77)

## 2021-09-04 LAB — TSH: TSH: 1.73 u[IU]/mL (ref 0.35–5.50)

## 2021-09-04 LAB — LIPID PANEL W/REFLEX DIRECT LDL
Cholesterol: 212 mg/dL — ABNORMAL HIGH (ref <200)
HDL: 77 mg/dL (ref >=50)
LDL Cholesterol (Calc): 110 mg/dL (calc) — ABNORMAL HIGH
Non-HDL Cholesterol (Calc): 135 mg/dL (calc) — ABNORMAL HIGH (ref <130)
Total CHOL/HDL Ratio: 2.8 (calc) (ref <5.0)
Triglycerides: 142 mg/dL (ref <150)

## 2021-09-04 LAB — B12 AND FOLATE PANEL
Folate: >24.2 ng/mL (ref >5.9)
Vitamin B-12: >1504 pg/mL — ABNORMAL HIGH (ref 211–911)

## 2021-09-04 LAB — VITAMIN D 25 HYDROXY (VIT D DEFICIENCY, FRACTURES): VITD: 60.08 ng/mL (ref 30.00–100.00)

## 2021-09-04 LAB — SEDIMENTATION RATE: Sed Rate: 17 mm/hr (ref 0–30)

## 2021-09-04 LAB — HEMOGLOBIN A1C: Hgb A1c MFr Bld: 5.6 % (ref 4.6–6.5)

## 2021-09-04 MED ORDER — NA SULFATE-K SULFATE-MG SULF 17.5-3.13-1.6 GM/177ML PO SOLN: 1.0000 | Freq: Once | ORAL | 0 refills | Status: AC

## 2021-09-04 NOTE — Progress Notes (Signed)
Gastroenterology Pre-Procedure Review ? ?Request Date: 10/07/2021 ?Requesting Physician: Dr. Tobi Bastos ? ?PATIENT REVIEW QUESTIONS: The patient responded to the following health history questions as indicated:   ? ?1. Are you having any GI issues? no ?2. Do you have a personal history of Polyps? no ?3. Do you have a family history of Colon Cancer or Polyps? no ?4. Diabetes Mellitus? no ?5. Joint replacements in the past 12 months?no ?6. Major health problems in the past 3 months?no ?7. Any artificial heart valves, MVP, or defibrillator?no ?   ?MEDICATIONS & ALLERGIES:    ?Patient reports the following regarding taking any anticoagulation/antiplatelet therapy:   ?Plavix, Coumadin, Eliquis, Xarelto, Lovenox, Pradaxa, Brilinta, or Effient? no ?Aspirin? no ? ?Patient confirms/reports the following medications:  ?Current Outpatient Medications  ?Medication Sig Dispense Refill  ? acetaminophen (TYLENOL) 500 MG tablet Take 1,000 mg by mouth every 6 (six) hours as needed for moderate pain or headache. (Patient not taking: Reported on 09/03/2021)    ? Ascorbic Acid (VITAMIN C) 1000 MG tablet Take 2,000 mg by mouth daily. (Patient not taking: Reported on 09/03/2021)    ? Brimonidine Tartrate (LUMIFY) 0.025 % SOLN Place 1 drop into both eyes daily. (Patient not taking: Reported on 09/03/2021)    ? cetirizine (ZYRTEC) 10 MG tablet Take 10 mg by mouth daily as needed for allergies. (Patient not taking: Reported on 09/03/2021)    ? cholecalciferol (VITAMIN D3) 25 MCG (1000 UNIT) tablet Take 1,000 Units by mouth daily. (Patient not taking: Reported on 09/03/2021)    ? Flaxseed, Linseed, (FLAX SEED OIL PO) Take 1,200 mg by mouth daily. (Patient not taking: Reported on 09/03/2021)    ? HYDROcodone-acetaminophen (NORCO/VICODIN) 5-325 MG tablet Take 1-2 tablets by mouth every 6 (six) hours as needed for moderate pain. (Patient not taking: Reported on 09/03/2021) 20 tablet 0  ? ibuprofen (ADVIL) 200 MG tablet Take 800 mg by mouth every 6 (six)  hours as needed for headache or moderate pain. (Patient not taking: Reported on 09/03/2021)    ? losartan (COZAAR) 100 MG tablet Take 100 mg by mouth daily.    ? Magnesium 250 MG TABS Take 250 mg by mouth daily. (Patient not taking: Reported on 09/03/2021)    ? Methylsulfonylmethane (MSM) 1000 MG CAPS Take 3,000 mg by mouth daily.  (Patient not taking: Reported on 09/03/2021)    ? metoprolol succinate (TOPROL-XL) 25 MG 24 hr tablet Take 25 mg by mouth daily.    ? Multiple Vitamins-Minerals (BARIATRIC MULTIVITAMINS/IRON PO) Take 1 tablet by mouth daily.    ? ondansetron (ZOFRAN ODT) 4 MG disintegrating tablet Take 1 tablet (4 mg total) by mouth every 8 (eight) hours as needed. (Patient not taking: Reported on 09/03/2021) 20 tablet 0  ? OVER THE COUNTER MEDICATION Take 500 mg by mouth daily. Gaba otc supplement (Patient not taking: Reported on 09/03/2021)    ? pantoprazole (PROTONIX) 40 MG tablet Take 1 tablet (40 mg total) by mouth daily. (Patient not taking: Reported on 11/01/2019) 90 tablet 0  ? vitamin B-12 (CYANOCOBALAMIN) 1000 MCG tablet Take 1,000 mcg by mouth daily. (Patient not taking: Reported on 09/03/2021)    ? zinc gluconate 50 MG tablet Take 50 mg by mouth daily. (Patient not taking: Reported on 09/03/2021)    ? ?No current facility-administered medications for this visit.  ? ? ?Patient confirms/reports the following allergies:  ?Allergies  ?Allergen Reactions  ? Lisinopril Cough  ? ? ?No orders of the defined types were placed in this encounter. ? ? ?  AUTHORIZATION INFORMATION ?Primary Insurance: ?1D#: ?Group #: ? ?Secondary Insurance: ?1D#: ?Group #: ? ?SCHEDULE INFORMATION: ?Date: 10/07/2021 ?Time: ?Location: ?[facility] ?

## 2021-09-04 NOTE — Assessment & Plan Note (Addendum)
Done in 2020.  Preoperative  Weight was 260 lbs.  ?

## 2021-09-04 NOTE — Assessment & Plan Note (Signed)

## 2021-09-04 NOTE — Assessment & Plan Note (Signed)
Well controlled on current regimen of metoprolol 25  mg daily and losartan 100 mg  . Renal function stable, no changes today. ?

## 2021-09-04 NOTE — Assessment & Plan Note (Addendum)
Explained the lack of specifity  Of an ANA .  She has had rheumatologic evaluation and no signs of autoimmune conditions despite ANA positive.  ESR is normal ? ?Lab Results  ?Component Value Date  ? ESRSEDRATE 17 09/03/2021  ? ? ?

## 2021-09-10 ENCOUNTER — Ambulatory Visit
Admission: RE | Admit: 2021-09-10 | Discharge: 2021-09-10 | Disposition: A | Discharge status: Home / Self Care | Payer: Managed Care, Other (non HMO) | Source: Ambulatory Visit | Attending: Internal Medicine | Admitting: Internal Medicine

## 2021-09-10 DIAGNOSIS — Z1231 Encounter for screening mammogram for malignant neoplasm of breast: Secondary | ICD-10-CM | POA: Diagnosis present

## 2021-10-07 ENCOUNTER — Encounter: Payer: Self-pay | Admitting: Gastroenterology

## 2021-10-07 ENCOUNTER — Ambulatory Visit: Payer: Managed Care, Other (non HMO) | Admitting: Certified Registered"

## 2021-10-07 ENCOUNTER — Encounter
Admission: RE | Disposition: A | Discharge status: Home / Self Care | Payer: Self-pay | Source: Home / Self Care | Attending: Gastroenterology

## 2021-10-07 ENCOUNTER — Ambulatory Visit
Admission: RE | Admit: 2021-10-07 | Discharge: 2021-10-07 | Disposition: A | Discharge status: Home / Self Care | Payer: Managed Care, Other (non HMO) | Attending: Gastroenterology | Admitting: Gastroenterology

## 2021-10-07 DIAGNOSIS — I129 Hypertensive chronic kidney disease with stage 1 through stage 4 chronic kidney disease, or unspecified chronic kidney disease: Secondary | ICD-10-CM | POA: Diagnosis not present

## 2021-10-07 DIAGNOSIS — Z6832 Body mass index (BMI) 32.0-32.9, adult: Secondary | ICD-10-CM | POA: Insufficient documentation

## 2021-10-07 DIAGNOSIS — Z9884 Bariatric surgery status: Secondary | ICD-10-CM | POA: Diagnosis not present

## 2021-10-07 DIAGNOSIS — N183 Chronic kidney disease, stage 3 unspecified: Secondary | ICD-10-CM | POA: Diagnosis not present

## 2021-10-07 DIAGNOSIS — Z1211 Encounter for screening for malignant neoplasm of colon: Secondary | ICD-10-CM | POA: Insufficient documentation

## 2021-10-07 DIAGNOSIS — Z9049 Acquired absence of other specified parts of digestive tract: Secondary | ICD-10-CM | POA: Insufficient documentation

## 2021-10-07 DIAGNOSIS — E669 Obesity, unspecified: Secondary | ICD-10-CM | POA: Diagnosis not present

## 2021-10-07 DIAGNOSIS — G4733 Obstructive sleep apnea (adult) (pediatric): Secondary | ICD-10-CM | POA: Insufficient documentation

## 2021-10-07 HISTORY — PX: COLONOSCOPY WITH PROPOFOL: SHX5780

## 2021-10-07 SURGERY — COLONOSCOPY WITH PROPOFOL
Anesthesia: General

## 2021-10-07 MED ORDER — PROPOFOL 500 MG/50ML IV EMUL
INTRAVENOUS | Status: AC
  Filled 2021-10-07: qty 50

## 2021-10-07 MED ORDER — LIDOCAINE HCL (PF) 2 % IJ SOLN
INTRAMUSCULAR | Status: AC
  Filled 2021-10-07: qty 5

## 2021-10-07 MED ORDER — PROPOFOL 500 MG/50ML IV EMUL
INTRAVENOUS | Status: DC | PRN
  Administered 2021-10-07: 150 ug/kg/min via INTRAVENOUS

## 2021-10-07 MED ORDER — LIDOCAINE HCL (CARDIAC) PF 100 MG/5ML IV SOSY
PREFILLED_SYRINGE | INTRAVENOUS | Status: DC | PRN
  Administered 2021-10-07: 50 mg via INTRAVENOUS

## 2021-10-07 MED ORDER — SODIUM CHLORIDE 0.9 % IV SOLN
INTRAVENOUS | Status: DC
  Administered 2021-10-07: 1000 mL via INTRAVENOUS

## 2021-10-07 MED ORDER — PROPOFOL 10 MG/ML IV BOLUS
INTRAVENOUS | Status: DC | PRN
  Administered 2021-10-07: 20 mg via INTRAVENOUS
  Administered 2021-10-07: 60 mg via INTRAVENOUS

## 2021-10-07 NOTE — Anesthesia Preprocedure Evaluation (Signed)
Anesthesia Evaluation  ?Patient identified by MRN, date of birth, ID band ?Patient awake ? ? ? ?Reviewed: ?Allergy & Precautions, NPO status , Patient's Chart, lab work & pertinent test results ? ?History of Anesthesia Complications ?(+) PONV and history of anesthetic complications ? ?Airway ?Mallampati: III ? ?TM Distance: <3 FB ?Neck ROM: full ? ? ? Dental ? ?(+) Chipped ?  ?Pulmonary ?sleep apnea ,  ?  ?Pulmonary exam normal ? ? ? ? ? ? ? Cardiovascular ?hypertension, Normal cardiovascular exam+ dysrhythmias  ? ? ?  ?Neuro/Psych ? Headaches, Anxiety negative psych ROS  ? GI/Hepatic ?Neg liver ROS, GERD  Controlled,  ?Endo/Other  ?negative endocrine ROS ? Renal/GU ?Renal disease  ?negative genitourinary ?  ?Musculoskeletal ? ? Abdominal ?  ?Peds ? Hematology ?negative hematology ROS ?(+)   ?Anesthesia Other Findings ?Past Medical History: ?No date: Anemia ?    Comment:  vitamin d deficiency ?No date: Anxiety ?    Comment:  at night ?No date: CKD (chronic kidney disease), stage III ([place]) ?10/2019: Cystitis ?    Comment:  required Keflex ?No date: Dysrhythmia ?    Comment:  svt ?No date: GERD (gastroesophageal reflux disease) ?    Comment:  history of ?No date: History of cold sores ?No date: Hypertension ?No date: Lymphedema ?    Comment:  Bilateral lower extremity ?No date: Migraines ?    Comment:  history of ?No date: Obesity ?No date: OSA on CPAP ?    Comment:  uses a cpap ?No date: PONV (postoperative nausea and vomiting) ?No date: Restless leg syndrome ?No date: Skin rash ?    Comment:  due to stress ?No date: Tachycardia ?No date: Vitamin D deficiency ? ?Past Surgical History: ?No date: BLADDER SUSPENSION ?No date: CARDIAC CATHETERIZATION ?No date: CESAREAN SECTION ?    Comment:  x2 ?No date: CHOLECYSTECTOMY ?No date: COLONOSCOPY ?No date: FOOT SURGERY; Right ?    Comment:  x2 ?01/09/2019: LAPAROSCOPIC GASTRIC SLEEVE RESECTION; N/A ?    Comment:  Procedure: LAPAROSCOPIC  GASTRIC SLEEVE RESECTION, UPPER  ?             ENDO;  Surgeon: Johnathan Hausen, MD;  Location: [facility];   ?             Service: General;  Laterality: N/A; ?No date: TONSILLECTOMY AND ADENOIDECTOMY ?No date: UPPER GI ENDOSCOPY ?No date: WISDOM TOOTH EXTRACTION ? ?BMI   ? Body Mass Index: 32.35 kg/m?  ?  ? ? Reproductive/Obstetrics ?negative OB ROS ? ?  ? ? ? ? ? ? ? ? ? ? ? ? ? ?  ?  ? ? ? ? ? ? ? ? ?Anesthesia Physical ?Anesthesia Plan ? ?ASA: 3 ? ?Anesthesia Plan: General  ? ?Post-op Pain Management:   ? ?Induction: Intravenous ? ?PONV Risk Score and Plan: Propofol infusion and TIVA ? ?Airway Management Planned: Natural Airway and Nasal Cannula ? ?Additional Equipment:  ? ?Intra-op Plan:  ? ?Post-operative Plan:  ? ?Informed Consent: I have reviewed the patients History and Physical, chart, labs and discussed the procedure including the risks, benefits and alternatives for the proposed anesthesia with the patient or authorized representative who has indicated his/her understanding and acceptance.  ? ? ? ?Dental Advisory Given ? ?Plan Discussed with: Anesthesiologist, CRNA and Surgeon ? ?Anesthesia Plan Comments: (Patient consented for risks of anesthesia including but not limited to:  ?- adverse reactions to medications ?- risk of airway placement if required ?- damage to eyes, teeth, lips  or other oral mucosa ?- nerve damage due to positioning  ?- sore throat or hoarseness ?- Damage to heart, brain, nerves, lungs, other parts of body or loss of life ? ?Patient voiced understanding.)  ? ? ? ? ? ? ?Anesthesia Quick Evaluation ? ?

## 2021-10-07 NOTE — Op Note (Signed)
[facility] ?Gastroenterology ?Patient Name: Barbara Buckley ?Procedure Date: 10/07/2021 7:27 AM ?MRN: [id number] ?Account #: [id number] ?Date of Birth: 23-Jul-1960 ?Admit Type: Outpatient ?Age: 61 ?Room: [facility] ENDO ROOM 2 ?Gender: Female ?Note Status: Finalized ?Instrument Name: Colonoscope [id number] ?Procedure:             Colonoscopy ?Indications:           Screening for colorectal malignant neoplasm ?Providers:             Jonathon Bellows MD, MD ?Medicines:             Monitored Anesthesia Care ?Complications:         No immediate complications. ?Procedure:             Pre-Anesthesia Assessment: ?                       - Prior to the procedure, a History and Physical was  ?                       performed, and patient medications, allergies and  ?                       sensitivities were reviewed. The patient's tolerance  ?                       of previous anesthesia was reviewed. ?                       - The risks and benefits of the procedure and the  ?                       sedation options and risks were discussed with the  ?                       patient. All questions were answered and informed  ?                       consent was obtained. ?                       - ASA Grade Assessment: II - A patient with mild  ?                       systemic disease. ?                       After obtaining informed consent, the colonoscope was  ?                       passed under direct vision. Throughout the procedure,  ?                       the patient's blood pressure, pulse, and oxygen  ?                       saturations were monitored continuously. The  ?                       Colonoscope was introduced through the anus and  ?  advanced to the the cecum, identified by the  ?                       appendiceal orifice. The colonoscopy was performed  ?                       with ease. The patient tolerated the procedure well.  ?                       The quality of the bowel preparation  was excellent. ?Findings: ?     The perianal and digital rectal examinations were normal. ?     The entire examined colon appeared normal on direct and retroflexion  ?     views. ?Impression:            - The entire examined colon is normal on direct and  ?                       retroflexion views. ?                       - No specimens collected. ?Recommendation:        - Discharge patient to home (with escort). ?                       - Resume previous diet. ?                       - Continue present medications. ?                       - Repeat colonoscopy in 10 years for screening  ?                       purposes. ?Procedure Code(s):     --- Professional --- ?                       [phone], Colonoscopy, flexible; diagnostic, including  ?                       collection of specimen(s) by brushing or washing, when  ?                       performed (separate procedure) ?Diagnosis Code(s):     --- Professional --- ?                       Z12.11, Encounter for screening for malignant neoplasm  ?                       of colon ?CPT copyright 2019 [employer]. All rights reserved. ?The codes documented in this report are preliminary and upon coder review may  ?be revised to meet current compliance requirements. ?Jonathon Bellows, MD ?Jonathon Bellows MD, MD ?10/07/2021 8:00:27 AM ?This report has been signed electronically. ?Number of Addenda: 0 ?Note Initiated On: 10/07/2021 7:27 AM ?Scope Withdrawal Time: 0 hours 10 minutes 55 seconds  ?Total Procedure Duration: 0 hours 13 minutes 13 seconds  ?Estimated Blood Loss:  Estimated blood loss: none. ?     [facility] ?

## 2021-10-07 NOTE — H&P (Signed)
? ? ? ?Jonathon Bellows, MD ?[address] [place], [place], [place], [address] ?[address] [place], [place], [place], [address] ?Phone: [phone]  ?Fax: [phone] ? ?Primary Care Physician:  Crecencio Mc, MD ? ? ?Pre-Procedure History & Physical: ?HPI:  Barbara Buckley is a 61 y.o. female is here for an colonoscopy. ?  ?Past Medical History:  ?Diagnosis Date  ? Anemia   ? vitamin d deficiency  ? Anxiety   ? at night  ? CKD (chronic kidney disease), stage III ([place])   ? Cystitis 10/2019  ? required Keflex  ? Dysrhythmia   ? svt  ? GERD (gastroesophageal reflux disease)   ? history of  ? History of cold sores   ? Hypertension   ? Lymphedema   ? Bilateral lower extremity  ? Migraines   ? history of  ? Obesity   ? OSA on CPAP   ? uses a cpap  ? PONV (postoperative nausea and vomiting)   ? Restless leg syndrome   ? Skin rash   ? due to stress  ? Tachycardia   ? Vitamin D deficiency   ? ? ?Past Surgical History:  ?Procedure Laterality Date  ? BLADDER SUSPENSION    ? CARDIAC CATHETERIZATION    ? CESAREAN SECTION    ? x2  ? CHOLECYSTECTOMY    ? COLONOSCOPY    ? FOOT SURGERY Right   ? x2  ? LAPAROSCOPIC GASTRIC SLEEVE RESECTION N/A 01/09/2019  ? Procedure: LAPAROSCOPIC GASTRIC SLEEVE RESECTION, UPPER ENDO;  Surgeon: Johnathan Hausen, MD;  Location: [facility];  Service: General;  Laterality: N/A;  ? TONSILLECTOMY AND ADENOIDECTOMY    ? UPPER GI ENDOSCOPY    ? WISDOM TOOTH EXTRACTION    ? ? ?Prior to Admission medications   ?Medication Sig Start Date End Date Taking? Authorizing Provider  ?losartan (COZAAR) 100 MG tablet Take 100 mg by mouth daily. 06/19/18  Yes [provider]  ?metoprolol succinate (TOPROL-XL) 25 MG 24 hr tablet Take 25 mg by mouth daily. 06/19/18  Yes [provider]  ?Multiple Vitamins-Minerals (BARIATRIC MULTIVITAMINS/IRON PO) Take 1 tablet by mouth daily.   Yes [provider]  ?acetaminophen (TYLENOL) 500 MG tablet Take 1,000 mg by mouth every 6 (six) hours as needed  for moderate pain or headache. ?Patient not taking: Reported on 09/03/2021    [provider]  ?Ascorbic Acid (VITAMIN C) 1000 MG tablet Take 2,000 mg by mouth daily. ?Patient not taking: Reported on 09/03/2021    [provider]  ?Brimonidine Tartrate (LUMIFY) 0.025 % SOLN Place 1 drop into both eyes daily. ?Patient not taking: Reported on 09/03/2021    [provider]  ?cetirizine (ZYRTEC) 10 MG tablet Take 10 mg by mouth daily as needed for allergies. ?Patient not taking: Reported on 09/03/2021    [provider]  ?cholecalciferol (VITAMIN D3) 25 MCG (1000 UNIT) tablet Take 1,000 Units by mouth daily. ?Patient not taking: Reported on 09/03/2021    [provider]  ?Flaxseed, Linseed, (FLAX SEED OIL PO) Take 1,200 mg by mouth daily. ?Patient not taking: Reported on 09/03/2021    [provider]  ?HYDROcodone-acetaminophen (NORCO/VICODIN) 5-325 MG tablet Take 1-2 tablets by mouth every 6 (six) hours as needed for moderate pain. ?Patient not taking: Reported on 09/03/2021 11/09/19   Jules Husbands, MD  ?ibuprofen (ADVIL) 200 MG tablet Take 800 mg by mouth every 6 (six) hours as needed for headache or moderate pain. ?Patient not taking: Reported  on 09/03/2021    [provider]  ?Magnesium 250 MG TABS Take 250 mg by mouth daily. ?Patient not taking: Reported on 09/03/2021    [provider]  ?Methylsulfonylmethane (MSM) 1000 MG CAPS Take 3,000 mg by mouth daily.  ?Patient not taking: Reported on 09/03/2021    [provider]  ?ondansetron (ZOFRAN ODT) 4 MG disintegrating tablet Take 1 tablet (4 mg total) by mouth every 8 (eight) hours as needed. ?Patient not taking: Reported on 09/03/2021 10/30/19   Rudene Re, MD  ?OVER THE COUNTER MEDICATION Take 500 mg by mouth daily. Gaba otc supplement ?Patient not taking: Reported on 09/03/2021    [provider]  ?pantoprazole (PROTONIX) 40 MG tablet Take 1 tablet (40 mg total) by mouth  daily. ?Patient not taking: Reported on 11/01/2019 01/10/19   Johnathan Hausen, MD  ?vitamin B-12 (CYANOCOBALAMIN) 1000 MCG tablet Take 1,000 mcg by mouth daily. ?Patient not taking: Reported on 09/03/2021    [provider]  ?zinc gluconate 50 MG tablet Take 50 mg by mouth daily. ?Patient not taking: Reported on 09/03/2021    [provider]  ? ? ?Allergies as of 09/04/2021 - Review Complete 09/04/2021  ?Allergen Reaction Noted  ? Lisinopril Cough 12/12/2013  ? ? ?Family History  ?Problem Relation Age of Onset  ? Hypertension Mother   ? Hyperlipidemia Mother   ? Breast cancer Paternal Aunt   ? Breast cancer Paternal Aunt   ? Ovarian cancer Maternal Grandmother   ? Alcohol abuse Maternal Grandfather   ? Drug abuse Brother   ? ? ?Social History  ? ?Socioeconomic History  ? Marital status: Married  ?  Spouse name: Not on file  ? Number of children: Not on file  ? Years of education: Not on file  ? Highest education level: Not on file  ?Occupational History  ? Not on file  ?Tobacco Use  ? Smoking status: Never  ? Smokeless tobacco: Never  ?Vaping Use  ? Vaping Use: Never used  ?Substance and Sexual Activity  ? Alcohol use: Not Currently  ?  Comment: occasional 0-2 drinks per month  ? Drug use: Never  ? Sexual activity: Not on file  ?Other Topics Concern  ? Not on file  ?Social History Narrative  ? Not on file  ? ?Social Determinants of Health  ? ?Financial Resource Strain: Not on file  ?Food Insecurity: Not on file  ?Transportation Needs: Not on file  ?Physical Activity: Not on file  ?Stress: Not on file  ?Social Connections: Not on file  ?Intimate Partner Violence: Not on file  ? ? ?Review of Systems: ?See HPI, otherwise negative ROS ? ?Physical Exam: ?BP (!) 144/84   Pulse 86   Temp (!) 97.1 ?F (36.2 ?C) (Temporal)   Resp 16   Ht 5\' 6"  (1.676 m)   Wt 90.9 kg   SpO2 100%   BMI 32.35 kg/m?  ?General:   Alert,  pleasant and cooperative in NAD ?Head:  Normocephalic and atraumatic. ?Neck:  Supple; no  masses or thyromegaly. ?Lungs:  Clear throughout to auscultation, normal respiratory effort.    ?Heart:  +S1, +S2, Regular rate and rhythm, No edema. ?Abdomen:  Soft, nontender and nondistended. Normal bowel sounds, without guarding, and without rebound.   ?Neurologic:  Alert and  oriented x4;  grossly normal neurologically. ? ?Impression/Plan: ?Barbara Buckley is here for an colonoscopy to be performed for Screening colonoscopy average risk   ?Risks, benefits, limitations, and alternatives regarding  colonoscopy  have been reviewed with the patient.  Questions have been answered.  All parties agreeable. ? ? ?Jonathon Bellows, MD  10/07/2021, 7:40 AM ? ?

## 2021-10-07 NOTE — Anesthesia Postprocedure Evaluation (Signed)
Anesthesia Post Note ? ?Patient: Barbara Buckley ? ?Procedure(s) Performed: COLONOSCOPY WITH PROPOFOL ? ?Patient location during evaluation: Endoscopy ?Anesthesia Type: General ?Level of consciousness: awake and alert ?Pain management: pain level controlled ?Vital Signs Assessment: post-procedure vital signs reviewed and stable ?Respiratory status: spontaneous breathing, nonlabored ventilation, respiratory function stable and patient connected to nasal cannula oxygen ?Cardiovascular status: blood pressure returned to baseline and stable ?Postop Assessment: no apparent nausea or vomiting ?Anesthetic complications: no ? ? ?No notable events documented. ? ? ?Last Vitals:  ?Vitals:  ? 10/07/21 0812 10/07/21 7510  ?BP: (!) 110/93 129/83  ?Pulse: 73 68  ?Resp: 19 16  ?Temp:    ?SpO2: 99% 100%  ?  ?Last Pain:  ?Vitals:  ? 10/07/21 0822  ?TempSrc:   ?PainSc: 0-No pain  ? ? ?  ?  ?  ?  ?  ?  ? ?[name] [name] ? ? ? ? ?

## 2021-10-07 NOTE — Transfer of Care (Signed)
Immediate Anesthesia Transfer of Care Note ? ?Patient: Barbara Buckley Velis ? ?Procedure(s) Performed: COLONOSCOPY WITH PROPOFOL ? ?Patient Location: PACU and Endoscopy Unit ? ?Anesthesia Type:General ? ?Level of Consciousness: awake, drowsy and patient cooperative ? ?Airway & Oxygen Therapy: Patient Spontanous Breathing ? ?Post-op Assessment: Report given to RN and Post -op Vital signs reviewed and stable ? ?Post vital signs: Reviewed and stable ? ?Last Vitals:  ?Vitals Value Taken Time  ?BP 123/76 10/07/21 0802  ?Temp    ?Pulse 80 10/07/21 0802  ?Resp 19 10/07/21 0802  ?SpO2 100 % 10/07/21 0802  ?Vitals shown include unvalidated device data. ? ?Last Pain:  ?Vitals:  ? 10/07/21 0710  ?TempSrc: Temporal  ?PainSc: 6   ?   ? ?Patients Stated Pain Goal: 0 (10/07/21 0710) ? ?Complications: No notable events documented. ?

## 2021-10-07 NOTE — Anesthesia Procedure Notes (Signed)
Procedure Name: [place] ?Date/Time: 10/07/2021 7:50 AM ?Performed by: Jerrye Noble, CRNA ?Pre-anesthesia Checklist: Patient identified, Emergency Drugs available, Suction available and Patient being monitored ?Patient Re-evaluated:Patient Re-evaluated prior to induction ?Oxygen Delivery Method: Nasal cannula ? ? ? ? ?

## 2021-10-08 ENCOUNTER — Encounter: Payer: Self-pay | Admitting: Gastroenterology

## 2022-03-06 ENCOUNTER — Ambulatory Visit (INDEPENDENT_AMBULATORY_CARE_PROVIDER_SITE_OTHER): Payer: Managed Care, Other (non HMO) | Admitting: Internal Medicine

## 2022-03-06 ENCOUNTER — Encounter: Payer: Self-pay | Admitting: Internal Medicine

## 2022-03-06 ENCOUNTER — Other Ambulatory Visit: Payer: Self-pay

## 2022-03-06 VITALS — BP 110/68 | HR 89 | Temp 98.2°F | Ht 66.0 in | Wt 211.8 lb

## 2022-03-06 DIAGNOSIS — G4452 New daily persistent headache (NDPH): Secondary | ICD-10-CM | POA: Diagnosis not present

## 2022-03-06 DIAGNOSIS — R7301 Impaired fasting glucose: Secondary | ICD-10-CM

## 2022-03-06 DIAGNOSIS — G4733 Obstructive sleep apnea (adult) (pediatric): Secondary | ICD-10-CM

## 2022-03-06 DIAGNOSIS — E785 Hyperlipidemia, unspecified: Secondary | ICD-10-CM | POA: Diagnosis not present

## 2022-03-06 DIAGNOSIS — E66811 Obesity, class 1: Secondary | ICD-10-CM

## 2022-03-06 DIAGNOSIS — I10 Essential (primary) hypertension: Secondary | ICD-10-CM

## 2022-03-06 DIAGNOSIS — E669 Obesity, unspecified: Secondary | ICD-10-CM

## 2022-03-06 DIAGNOSIS — N1831 Chronic kidney disease, stage 3a: Secondary | ICD-10-CM | POA: Diagnosis not present

## 2022-03-06 NOTE — Patient Instructions (Signed)
MRI Brain ordered to rule out mass/meningioma  Return for repeat labs when you are able

## 2022-03-06 NOTE — Progress Notes (Unsigned)
Subjective:  Patient ID: Barbara Buckley, female    DOB: [birth year]  Age: 61 y.o. MRN: [id number]  CC: The primary encounter diagnosis was Stage 3a chronic kidney disease ([place]). Diagnoses of Hyperlipidemia LDL goal <130, Impaired fasting glucose, New daily persistent headache, OSA (obstructive sleep apnea), Obesity (BMI 30.0-34.9), and Essential hypertension were also pertinent to this visit.   HPI Barbara Buckley presents for new onset headaches  Chief Complaint  Patient presents with   Follow-up    Remote history of migraines  occurring premenopausal  pattern presents with new onset headaches for the past month. The headaches occur suddenly and have been occurring once or twice daily .  The  headaches occur in no specific location and are not accompanied by nausea, neck stiffness, or changes in vision , and usually resolve spontaneously , other  times resolve with tylenol.  She has woken up with headaches on occasion. They do not occur with exertion .  She has OSA but has been compliant for years with use of CPAP .Her last sleep study was over ten years ago and she is using the original mache from her diagnosis,  but denies morning sleepiness and restless sleep.  Loves sleeping with it.  She reports restorative sleep , wakes up feeling refreshed, and denies daytime sleepiness   Has occasional tinnitus but not daily.  Vertigo occurs but usually lasts < 5 minutes. And will resolve with maneuver to move crystals   Last  eye exam < 1 yr ago.  History of bilateral laser surgery  Hypertension: patient checks blood pressure twice weekly at home.  Readings have been for the most part < 140/80 at rest . Patient is following a reduced salt diet most days and is taking losartan and metoprolol  as prescribed   Outpatient Medications Prior to Visit  Medication Sig Dispense Refill   acetaminophen (TYLENOL) 500 MG tablet Take 1,000 mg by mouth every 6 (six) hours as needed for moderate pain or  headache.     Brimonidine Tartrate (LUMIFY) 0.025 % SOLN Place 1 drop into both eyes daily.     cetirizine (ZYRTEC) 10 MG tablet Take 10 mg by mouth daily as needed for allergies.     losartan (COZAAR) 100 MG tablet Take 100 mg by mouth daily.     metoprolol succinate (TOPROL-XL) 25 MG 24 hr tablet Take 25 mg by mouth daily.     Multiple Vitamins-Minerals (BARIATRIC MULTIVITAMINS/IRON PO) Take 1 tablet by mouth daily.     Ascorbic Acid (VITAMIN C) 1000 MG tablet Take 2,000 mg by mouth daily. (Patient not taking: Reported on 09/03/2021)     cholecalciferol (VITAMIN D3) 25 MCG (1000 UNIT) tablet Take 1,000 Units by mouth daily. (Patient not taking: Reported on 09/03/2021)     Flaxseed, Linseed, (FLAX SEED OIL PO) Take 1,200 mg by mouth daily. (Patient not taking: Reported on 09/03/2021)     HYDROcodone-acetaminophen (NORCO/VICODIN) 5-325 MG tablet Take 1-2 tablets by mouth every 6 (six) hours as needed for moderate pain. (Patient not taking: Reported on 09/03/2021) 20 tablet 0   ibuprofen (ADVIL) 200 MG tablet Take 800 mg by mouth every 6 (six) hours as needed for headache or moderate pain. (Patient not taking: Reported on 09/03/2021)     Magnesium 250 MG TABS Take 250 mg by mouth daily. (Patient not taking: Reported on 09/03/2021)     Methylsulfonylmethane (MSM) 1000 MG CAPS Take 3,000 mg by mouth daily.  (Patient not taking: Reported on 09/03/2021)  ondansetron (ZOFRAN ODT) 4 MG disintegrating tablet Take 1 tablet (4 mg total) by mouth every 8 (eight) hours as needed. (Patient not taking: Reported on 09/03/2021) 20 tablet 0   OVER THE COUNTER MEDICATION Take 500 mg by mouth daily. Gaba otc supplement (Patient not taking: Reported on 09/03/2021)     pantoprazole (PROTONIX) 40 MG tablet Take 1 tablet (40 mg total) by mouth daily. (Patient not taking: Reported on 11/01/2019) 90 tablet 0   vitamin B-12 (CYANOCOBALAMIN) 1000 MCG tablet Take 1,000 mcg by mouth daily. (Patient not taking: Reported on 09/03/2021)      zinc gluconate 50 MG tablet Take 50 mg by mouth daily. (Patient not taking: Reported on 09/03/2021)     No facility-administered medications prior to visit.    Review of Systems;  Patient denies headache, fevers, malaise, unintentional weight loss, skin rash, eye pain, sinus congestion and sinus pain, sore throat, dysphagia,  hemoptysis , cough, dyspnea, wheezing, chest pain, palpitations, orthopnea, edema, abdominal pain, nausea, melena, diarrhea, constipation, flank pain, dysuria, hematuria, urinary  Frequency, nocturia, numbness, tingling, seizures,  Focal weakness, Loss of consciousness,  Tremor, insomnia, depression, anxiety, and suicidal ideation.      Objective:  BP 110/68 (BP Location: Left Arm, Patient Position: Sitting, Cuff Size: Large)   Pulse 89   Temp 98.2 F (36.8 C) (Oral)   Ht 5\' 6"  (1.676 m)   Wt 211 lb 12.8 oz (96.1 kg)   SpO2 97%   BMI 34.19 kg/m   BP Readings from Last 3 Encounters:  03/06/22 110/68  10/07/21 129/83  09/03/21 132/76    Wt Readings from Last 3 Encounters:  03/06/22 211 lb 12.8 oz (96.1 kg)  10/07/21 200 lb 6.4 oz (90.9 kg)  09/03/21 203 lb (92.1 kg)    General appearance: alert, cooperative and appears stated age Ears: normal TM's and external ear canals both ears Throat: lips, mucosa, and tongue normal; teeth and gums normal Neck: no adenopathy, no carotid bruit, supple, symmetrical, trachea midline and thyroid not enlarged, symmetric, no tenderness/mass/nodules Back: symmetric, no curvature. ROM normal. No CVA tenderness. Lungs: clear to auscultation bilaterally Heart: regular rate and rhythm, S1, S2 normal, no murmur, click, rub or gallop Abdomen: soft, non-tender; bowel sounds normal; no masses,  no organomegaly Pulses: 2+ and symmetric Skin: Skin color, texture, turgor normal. No rashes or lesions Lymph nodes: Cervical, supraclavicular, and axillary nodes normal. Neuro:  awake and interactive with normal mood and affect. Higher  cortical functions are normal. Speech is clear without word-finding difficulty or dysarthria. Extraocular movements are intact. Visual fields of both eyes are grossly intact. Sensation to light touch is grossly intact bilaterally of upper and lower extremities. Motor examination shows 4+/5 symmetric hand grip and upper extremity and 5/5 lower extremity strength. There is no pronation or drift. Gait is non-ataxic   Lab Results  Component Value Date   HGBA1C 5.6 09/03/2021    Lab Results  Component Value Date   CREATININE 0.85 09/03/2021   CREATININE 0.83 10/29/2019   CREATININE 0.88 01/09/2019    Lab Results  Component Value Date   WBC 4.9 09/03/2021   HGB 14.0 09/03/2021   HCT 40.9 09/03/2021   PLT 203.0 09/03/2021   GLUCOSE 83 09/03/2021   CHOL 212 (H) 09/03/2021   TRIG 142 09/03/2021   HDL 77 09/03/2021   LDLCALC 110 (H) 09/03/2021   ALT 18 09/03/2021   AST 20 09/03/2021   NA 138 09/03/2021   K 4.3 09/03/2021   CL  104 09/03/2021   CREATININE 0.85 09/03/2021   BUN 16 09/03/2021   CO2 25 09/03/2021   TSH 1.73 09/03/2021   HGBA1C 5.6 09/03/2021    No results found.  Assessment & Plan:   Problem List Items Addressed This Visit     OSA (obstructive sleep apnea)    Diagnosed by remote sleep study. Patient is using CPAP every night a minimum of 6 hours per night and notes improved daytime wakefulness and decreased fatigue       Essential hypertension    Well controlled on current regimen. Renal function stable, no changes today.      New daily persistent headache    Etiology unclear.  She has treated OSA , and is up to date on eye exams. Given her concurrent episodes of tinnitus and  Vertigo  MRI brain with A1Cs  to rule out schwannoma  , mass and chronic sinusitis      Relevant Orders   MR BRAIN/IAC W WO CONTRAST   Obesity (BMI 30.0-34.9)    Reviewed her weight trends since her bariatric surgery .  Currently not exercising and has gained weight.  .I have  addressed  BMI and recommended wt loss of 10% of body weigh over the next 6 months using a low glycemic index diet and regular exercise a minimum of 5 days per week.        Other Visit Diagnoses     Stage 3a chronic kidney disease ([place])    -  Primary   Relevant Orders   Comprehensive metabolic panel   CBC with Differential/Platelet   Hyperlipidemia LDL goal <130       Relevant Orders   TSH   Lipid Panel w/reflex Direct LDL   Impaired fasting glucose       Relevant Orders   Hemoglobin A1c       I spent a total of 30 minutes with this patient in a face to face visit on the date of this encounter reviewing the last office visit with me,   patient's diet and exercise habits, home blood pressure readings,   and post visit ordering of testing and therapeutics.    Follow-up: No follow-ups on file.   Crecencio Mc, MD

## 2022-03-07 DIAGNOSIS — G4452 New daily persistent headache (NDPH): Secondary | ICD-10-CM | POA: Insufficient documentation

## 2022-03-07 DIAGNOSIS — E669 Obesity, unspecified: Secondary | ICD-10-CM | POA: Insufficient documentation

## 2022-03-07 NOTE — Assessment & Plan Note (Signed)
Well controlled on current regimen. Renal function stable, no changes today. 

## 2022-03-07 NOTE — Assessment & Plan Note (Signed)
Etiology unclear.  She has treated OSA , and is up to date on eye exams. Given her concurrent episodes of tinnitus and  Vertigo  MRI brain with A1Cs  to rule out schwannoma  , mass and chronic sinusitis

## 2022-03-07 NOTE — Assessment & Plan Note (Signed)
Diagnosed by remote sleep study. Patient is using CPAP every night a minimum of 6 hours per night and notes improved daytime wakefulness and decreased fatigue

## 2022-03-07 NOTE — Assessment & Plan Note (Signed)
Reviewed her weight trends since her bariatric surgery .  Currently not exercising and has gained weight.  .I have addressed  BMI and recommended wt loss of 10% of body weigh over the next 6 months using a low glycemic index diet and regular exercise a minimum of 5 days per week.

## 2022-03-09 ENCOUNTER — Telehealth: Payer: Self-pay | Admitting: Internal Medicine

## 2022-03-09 ENCOUNTER — Other Ambulatory Visit (INDEPENDENT_AMBULATORY_CARE_PROVIDER_SITE_OTHER): Payer: Managed Care, Other (non HMO)

## 2022-03-09 DIAGNOSIS — R7301 Impaired fasting glucose: Secondary | ICD-10-CM

## 2022-03-09 DIAGNOSIS — E785 Hyperlipidemia, unspecified: Secondary | ICD-10-CM | POA: Diagnosis not present

## 2022-03-09 DIAGNOSIS — N1831 Chronic kidney disease, stage 3a: Secondary | ICD-10-CM

## 2022-03-09 LAB — CBC WITH DIFFERENTIAL/PLATELET
Basophils Absolute: 0 10*3/uL (ref 0.0–0.1)
Basophils Relative: 1.2 % (ref 0.0–3.0)
Eosinophils Absolute: 0.1 10*3/uL (ref 0.0–0.7)
Eosinophils Relative: 2 % (ref 0.0–5.0)
HCT: 38.2 % (ref 36.0–46.0)
Hemoglobin: 13.1 g/dL (ref 12.0–15.0)
Lymphocytes Relative: 36.7 % (ref 12.0–46.0)
Lymphs Abs: 1.2 10*3/uL (ref 0.7–4.0)
MCHC: 34.2 g/dL (ref 30.0–36.0)
MCV: 88.3 fl (ref 78.0–100.0)
Monocytes Absolute: 0.4 10*3/uL (ref 0.1–1.0)
Monocytes Relative: 10.7 % (ref 3.0–12.0)
Neutro Abs: 1.7 10*3/uL (ref 1.4–7.7)
Neutrophils Relative %: 49.4 % (ref 43.0–77.0)
Platelets: 181 10*3/uL (ref 150.0–400.0)
RBC: 4.33 Mil/uL (ref 3.87–5.11)
RDW: 13.6 % (ref 11.5–15.5)
WBC: 3.4 10*3/uL — ABNORMAL LOW (ref 4.0–10.5)

## 2022-03-09 LAB — COMPREHENSIVE METABOLIC PANEL
ALT: 18 U/L (ref 0–35)
AST: 18 U/L (ref 0–37)
Albumin: 4.1 g/dL (ref 3.5–5.2)
Alkaline Phosphatase: 64 U/L (ref 39–117)
BUN: 19 mg/dL (ref 6–23)
CO2: 25 mEq/L (ref 19–32)
Calcium: 9.2 mg/dL (ref 8.4–10.5)
Chloride: 106 mEq/L (ref 96–112)
Creatinine, Ser: 0.79 mg/dL (ref 0.40–1.20)
GFR: 81.2 mL/min (ref >60.00)
Glucose, Bld: 89 mg/dL (ref 70–99)
Potassium: 4.5 mEq/L (ref 3.5–5.1)
Sodium: 138 mEq/L (ref 135–145)
Total Bilirubin: 0.5 mg/dL (ref 0.2–1.2)
Total Protein: 6.8 g/dL (ref 6.0–8.3)

## 2022-03-09 LAB — TSH: TSH: 2.01 u[IU]/mL (ref 0.35–5.50)

## 2022-03-09 LAB — HEMOGLOBIN A1C: Hgb A1c MFr Bld: 5.6 % (ref 4.6–6.5)

## 2022-03-10 LAB — LIPID PANEL W/REFLEX DIRECT LDL
Cholesterol: 213 mg/dL — ABNORMAL HIGH (ref <200)
HDL: 83 mg/dL (ref >=50)
LDL Cholesterol (Calc): 111 mg/dL (calc) — ABNORMAL HIGH
Non-HDL Cholesterol (Calc): 130 mg/dL (calc) — ABNORMAL HIGH (ref <130)
Total CHOL/HDL Ratio: 2.6 (calc) (ref <5.0)
Triglycerides: 87 mg/dL (ref <150)

## 2022-03-30 ENCOUNTER — Ambulatory Visit: Payer: Managed Care, Other (non HMO)

## 2022-04-06 ENCOUNTER — Ambulatory Visit
Admission: RE | Admit: 2022-04-06 | Discharge: 2022-04-06 | Disposition: A | Discharge status: Home / Self Care | Payer: Managed Care, Other (non HMO) | Source: Ambulatory Visit | Attending: Internal Medicine | Admitting: Internal Medicine

## 2022-04-06 DIAGNOSIS — G4452 New daily persistent headache (NDPH): Secondary | ICD-10-CM | POA: Insufficient documentation

## 2022-04-06 MED ORDER — GADOBUTROL 1 MMOL/ML IV SOLN
9.0000 mL | Freq: Once | INTRAVENOUS | Status: AC | PRN
  Administered 2022-04-06: 9 mL via INTRAVENOUS

## 2022-07-31 ENCOUNTER — Encounter (HOSPITAL_COMMUNITY): Payer: Self-pay | Admitting: *Deleted

## 2022-08-21 ENCOUNTER — Other Ambulatory Visit: Payer: Self-pay | Admitting: Internal Medicine

## 2022-08-21 DIAGNOSIS — Z1231 Encounter for screening mammogram for malignant neoplasm of breast: Secondary | ICD-10-CM

## 2022-09-08 ENCOUNTER — Encounter: Payer: Self-pay | Admitting: Internal Medicine

## 2022-09-08 ENCOUNTER — Ambulatory Visit (INDEPENDENT_AMBULATORY_CARE_PROVIDER_SITE_OTHER): Payer: 59 | Admitting: Internal Medicine

## 2022-09-08 VITALS — BP 104/68 | HR 79 | Temp 98.2°F | Ht 66.0 in | Wt 208.0 lb

## 2022-09-08 DIAGNOSIS — Z114 Encounter for screening for human immunodeficiency virus [HIV]: Secondary | ICD-10-CM

## 2022-09-08 DIAGNOSIS — Z0001 Encounter for general adult medical examination with abnormal findings: Secondary | ICD-10-CM | POA: Diagnosis not present

## 2022-09-08 DIAGNOSIS — G4733 Obstructive sleep apnea (adult) (pediatric): Secondary | ICD-10-CM

## 2022-09-08 DIAGNOSIS — R600 Localized edema: Secondary | ICD-10-CM

## 2022-09-08 DIAGNOSIS — R7301 Impaired fasting glucose: Secondary | ICD-10-CM

## 2022-09-08 DIAGNOSIS — Z1159 Encounter for screening for other viral diseases: Secondary | ICD-10-CM | POA: Diagnosis not present

## 2022-09-08 DIAGNOSIS — G4452 New daily persistent headache (NDPH): Secondary | ICD-10-CM

## 2022-09-08 DIAGNOSIS — Z9884 Bariatric surgery status: Secondary | ICD-10-CM

## 2022-09-08 DIAGNOSIS — Z780 Asymptomatic menopausal state: Secondary | ICD-10-CM

## 2022-09-08 DIAGNOSIS — I10 Essential (primary) hypertension: Secondary | ICD-10-CM

## 2022-09-08 DIAGNOSIS — N952 Postmenopausal atrophic vaginitis: Secondary | ICD-10-CM | POA: Insufficient documentation

## 2022-09-08 DIAGNOSIS — E785 Hyperlipidemia, unspecified: Secondary | ICD-10-CM

## 2022-09-08 DIAGNOSIS — E669 Obesity, unspecified: Secondary | ICD-10-CM

## 2022-09-08 NOTE — Assessment & Plan Note (Signed)
Well controlled on current regimen of metoprolol and losartan. Renal function is due, no changes today.  Lab Results  Component Value Date   CREATININE 0.79 03/09/2022

## 2022-09-08 NOTE — Assessment & Plan Note (Signed)
Discussed role of vaginal estrogen .  She has deferred for now

## 2022-09-08 NOTE — Assessment & Plan Note (Addendum)
Occurring randomly 4 to 5 days per week.  Mild,  without vision changes or neck stiffness. MRI BRAIN with IAC's done and negative for masses  oct 2023.

## 2022-09-08 NOTE — Assessment & Plan Note (Signed)
Lymphedema less likely than chronic venous insufficiency.  She has deferred use of stockins and vascular evaluation.  Leg pain improves with leg elevation

## 2022-09-08 NOTE — Patient Instructions (Addendum)
Consider estrogen vaginal cream   I recommend that you  add  a daily bulk forming laxative (Citrucel, Metamucil, Benefiber, r Fibercon) 30 minutes prior to your largest meal to increase satiety and improve your bowel pattern   I will do your PAP smear next year   Your  DEXA scan  been ordered. recommend that you  to call  [place] to schedule your own  appointment at [facility]  , and their phone number is [phone]  Please request dr isenstein's office to request their records for me

## 2022-09-08 NOTE — Assessment & Plan Note (Signed)
Reviewed her weight trends since her bariatric surgery . She has lost 60 lbs since July 2020.

## 2022-09-08 NOTE — Assessment & Plan Note (Signed)

## 2022-09-08 NOTE — Assessment & Plan Note (Signed)
Diagnosed by remote sleep study. Patient is using CPAP every night a minimum of 6 hours per night and notes improved daytime wakefulness and decreased fatigue  

## 2022-09-08 NOTE — Progress Notes (Signed)
Patient ID: Barbara Buckley, female    DOB: 09-Aug-1960  Age: 62 y.o. MRN: [id number]  The patient is here for annual preventive examination and management of other chronic and acute problems.  Cervical CA screening done in 2022 by Rayne Du    The risk factors are reflected in the social history.   The roster of all physicians providing medical care to patient - is listed in the Snapshot section of the chart.   Activities of daily living:  The patient is 100% independent in all ADLs: dressing, toileting, feeding as well as independent mobility   Home safety : The patient has smoke detectors in the home. They wear seatbelts.  There are no unsecured firearms at home. There is no violence in the home.    There is no risks for hepatitis, STDs or HIV. There is no   history of blood transfusion. They have no travel history to infectious disease endemic areas of the world.   The patient has seen their dentist in the last six month. They have seen their eye doctor in the last year. The patinet  denies slight hearing difficulty with regard to whispered voices and some television programs.  They have deferred audiologic testing in the last year.  They do not  have excessive sun exposure. Discussed the need for sun protection: hats, long sleeves and use of sunscreen if there is significant sun exposure.    Diet: the importance of a healthy diet is discussed. They do have a healthy diet.   The benefits of regular aerobic exercise were discussed. The patient  exercises  3 to 5 days per week  for  60 minutes.    Depression screen: there are no signs or vegative symptoms of depression- irritability, change in appetite, anhedonia, sadness/tearfullness.   The following portions of the patient's history were reviewed and updated as appropriate: allergies, current medications, past family history, past medical history,  past surgical history, past social history  and problem list.   Visual acuity was  not assessed per patient preference since the patient has regular follow up with an  ophthalmologist. Hearing and body mass index were assessed and reviewed.    During the course of the visit the patient was educated and counseled about appropriate screening and preventive services including : fall prevention , diabetes screening, nutrition counseling, colorectal cancer screening, and recommended immunizations.    Chief Complaint:   1) obesity:  s/p bariatric surgery July 2020.  Has gien up refined sugar for the last 6 weeks no diet sodas or al but one cup of coffee.  Moderate alcohol (one glass on weekends)  2) chronic urticarial rash seeing isenstein   Review of Symptoms  Patient denies headache, fevers, malaise, unintentional weight loss, skin rash, eye pain, sinus congestion and sinus pain, sore throat, dysphagia,  hemoptysis , cough, dyspnea, wheezing, chest pain, palpitations, orthopnea, edema, abdominal pain, nausea, melena, diarrhea, constipation, flank pain, dysuria, hematuria, urinary  Frequency, nocturia, numbness, tingling, seizures,  Focal weakness, Loss of consciousness,  Tremor, insomnia, depression, anxiety, and suicidal ideation.    Physical Exam:  BP 104/68   Pulse 79   Temp 98.2 F (36.8 C) (Oral)   Ht 5\' 6"  (1.676 m)   Wt 208 lb (94.3 kg)   SpO2 97%   BMI 33.57 kg/m    Physical Exam  Assessment and Plan: Encounter for screening for HIV -     HIV Antibody (routine testing w rflx)  Need for hepatitis C  screening test -     Hepatitis C antibody  Essential hypertension Assessment & Plan: Well controlled on current regimen of metoprolol and losartan. Renal function is due, no changes today.  Lab Results  Component Value Date   CREATININE 0.79 03/09/2022     Orders: -     Comprehensive metabolic panel -     Microalbumin / creatinine urine ratio  Hyperlipidemia LDL goal <130 -     Lipid panel -     LDL cholesterol, direct  Obesity (BMI  30.0-34.9) Assessment & Plan: Reviewed her weight trends since her bariatric surgery . She has lost 60 lbs since July 2020.   Orders: -     TSH  New daily persistent headache Assessment & Plan: Occurring randomly 4 to 5 days per week.  Mild,  without vision changes or neck stiffness. MRI BRAIN with IAC's done and negative for masses  oct 2023.    Postmenopausal estrogen deficiency -     DG Bone Density; Future  Impaired fasting glucose -     Hemoglobin A1c  Encounter for general adult medical examination with abnormal findings Assessment & Plan: age appropriate education and counseling updated, referrals for preventative services and immunizations addressed, dietary and smoking counseling addressed, most recent labs reviewed.  I have personally reviewed and have noted:   1) the patient's medical and social history 2) The pt's use of alcohol, tobacco, and illicit drugs 3) The patient's current medications and supplements 4) Functional ability including ADL's, fall risk, home safety risk, hearing and visual impairment 5) Diet and physical activities 6) Evidence for depression or mood disorder 7) The patient's height, weight, and BMI have been recorded in the chart   I have made referrals, and provided counseling and education based on review of the above    S/P laparoscopic sleeve gastrectomyAugust 2020 Assessment & Plan: Continue bariatric multivitamins .  Starting weight was 252 in August prior to surgery and she is now 208 lbs. Encouraed to continue exercise and low GI diet    OSA (obstructive sleep apnea) Assessment & Plan: Diagnosed by remote sleep study. Patient is using CPAP every night a minimum of 6 hours per night and notes improved daytime wakefulness and decreased fatigue    Edema of both lower extremities Assessment & Plan: Lymphedema less likely than chronic venous insufficiency.  She has deferred use of stockins and vascular evaluation.  Leg pain improves  with leg elevation    Post-menopausal atrophic vaginitis Assessment & Plan: Discussed role of vaginal estrogen .  She has deferred for now      Return in about 1 year (around 09/08/2023).  Crecencio Mc, MD

## 2022-09-08 NOTE — Assessment & Plan Note (Addendum)
Continue bariatric multivitamins .  Starting weight was 252 in August prior to surgery and she is now 208 lbs. Encouraed to continue exercise and low GI diet

## 2022-09-09 LAB — LIPID PANEL
Cholesterol: 183 mg/dL (ref 0–200)
HDL: 63.4 mg/dL (ref >39.00)
LDL Cholesterol: 93 mg/dL (ref 0–99)
NonHDL: 119.98
Total CHOL/HDL Ratio: 3
Triglycerides: 134 mg/dL (ref 0.0–149.0)
VLDL: 26.8 mg/dL (ref 0.0–40.0)

## 2022-09-09 LAB — COMPREHENSIVE METABOLIC PANEL
ALT: 14 U/L (ref 0–35)
AST: 18 U/L (ref 0–37)
Albumin: 4.5 g/dL (ref 3.5–5.2)
Alkaline Phosphatase: 63 U/L (ref 39–117)
BUN: 28 mg/dL — ABNORMAL HIGH (ref 6–23)
CO2: 25 mEq/L (ref 19–32)
Calcium: 9.3 mg/dL (ref 8.4–10.5)
Chloride: 105 mEq/L (ref 96–112)
Creatinine, Ser: 0.77 mg/dL (ref 0.40–1.20)
GFR: 83.45 mL/min (ref >60.00)
Glucose, Bld: 85 mg/dL (ref 70–99)
Potassium: 4.1 mEq/L (ref 3.5–5.1)
Sodium: 137 mEq/L (ref 135–145)
Total Bilirubin: 0.4 mg/dL (ref 0.2–1.2)
Total Protein: 6.8 g/dL (ref 6.0–8.3)

## 2022-09-09 LAB — HEPATITIS C ANTIBODY: Hepatitis C Ab: NONREACTIVE

## 2022-09-09 LAB — LDL CHOLESTEROL, DIRECT: Direct LDL: 95 mg/dL

## 2022-09-09 LAB — HEMOGLOBIN A1C: Hgb A1c MFr Bld: 5.5 % (ref 4.6–6.5)

## 2022-09-09 LAB — HIV ANTIBODY (ROUTINE TESTING W REFLEX): HIV 1&2 Ab, 4th Generation: NONREACTIVE

## 2022-09-09 LAB — MICROALBUMIN / CREATININE URINE RATIO
Creatinine,U: 21 mg/dL
Microalb Creat Ratio: 3.3 mg/g (ref 0.0–30.0)
Microalb, Ur: <0.7 mg/dL (ref 0.0–1.9)

## 2022-09-09 LAB — TSH: TSH: 1.02 u[IU]/mL (ref 0.35–5.50)

## 2022-10-29 ENCOUNTER — Ambulatory Visit
Admission: RE | Admit: 2022-10-29 | Discharge: 2022-10-29 | Disposition: A | Discharge status: Home / Self Care | Payer: 59 | Source: Ambulatory Visit | Attending: Internal Medicine | Admitting: Internal Medicine

## 2022-10-29 DIAGNOSIS — Z780 Asymptomatic menopausal state: Secondary | ICD-10-CM

## 2022-10-29 DIAGNOSIS — Z1231 Encounter for screening mammogram for malignant neoplasm of breast: Secondary | ICD-10-CM | POA: Insufficient documentation

## 2023-02-21 ENCOUNTER — Encounter: Payer: Self-pay | Admitting: Internal Medicine

## 2023-02-21 DIAGNOSIS — F40243 Fear of flying: Secondary | ICD-10-CM

## 2023-02-23 DIAGNOSIS — F40243 Fear of flying: Secondary | ICD-10-CM | POA: Insufficient documentation

## 2023-02-23 MED ORDER — ALPRAZOLAM 0.25 MG PO TABS: 0.2500 mg | ORAL_TABLET | Freq: Two times a day (BID) | ORAL | 0 refills | Status: DC | PRN

## 2023-04-14 ENCOUNTER — Ambulatory Visit
Admission: EM | Admit: 2023-04-14 | Discharge: 2023-04-14 | Disposition: A | Discharge status: Home / Self Care | Payer: 59 | Attending: Emergency Medicine | Admitting: Emergency Medicine

## 2023-04-14 DIAGNOSIS — S61012A Laceration without foreign body of left thumb without damage to nail, initial encounter: Secondary | ICD-10-CM | POA: Diagnosis not present

## 2023-04-14 DIAGNOSIS — S61219A Laceration without foreign body of unspecified finger without damage to nail, initial encounter: Secondary | ICD-10-CM

## 2023-04-14 MED ORDER — CEPHALEXIN 500 MG PO CAPS: 500.0000 mg | ORAL_CAPSULE | Freq: Three times a day (TID) | ORAL | 0 refills | Status: AC

## 2023-04-14 NOTE — ED Provider Notes (Signed)
Barbara Buckley    CSN: [id number] Arrival date & time: 04/14/23  1456      History   Chief Complaint Chief Complaint  Patient presents with   Finger Injury    HPI Barbara Buckley is a 62 y.o. female.  Patient presents with lacerations on her left thumb, index, middle fingers that occurred on 04/12/2023.  She was slicing vegetables with a knife when she accidentally cut her fingers.  The tip of her thumb was cut off.  Bleeding controlled with direct pressure.  She has been cleaning the wounds and applying a topical antibiotic ointment.  No purulent drainage, fever, chills, numbness, weakness, or other symptoms.  Last tetanus 2023.  The history is provided by the patient and medical records.    Past Medical History:  Diagnosis Date   Anemia    vitamin d deficiency   Anxiety    at night   CKD (chronic kidney disease), stage III (HCC)    Cystitis 10/2019   required Keflex   Dysrhythmia    svt   GERD (gastroesophageal reflux disease)    history of   History of cold sores    Hypertension    Lymphedema    Bilateral lower extremity   Migraines    history of   Obesity    OSA on CPAP    uses a cpap   PONV (postoperative nausea and vomiting)    Restless leg syndrome    Skin rash    due to stress   Tachycardia    Vitamin D deficiency     Patient Active Problem List   Diagnosis Date Noted   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   Edema of both lower extremities 09/08/2022   Post-menopausal atrophic vaginitis 09/08/2022   New daily persistent headache 03/07/2022   Obesity (BMI 30.0-34.9) 03/07/2022   Myalgia 09/04/2021   Encounter for general adult medical examination with abnormal findings 09/04/2021   S/P laparoscopic sleeve gastrectomyAugust 2020 01/09/2019   SVT (supraventricular tachycardia) (HCC) 12/21/2018   OSA (obstructive sleep apnea) 12/21/2018   Essential hypertension 12/21/2018    Past Surgical History:  Procedure Laterality Date   BLADDER SUSPENSION      CARDIAC CATHETERIZATION     CESAREAN SECTION     x2   CHOLECYSTECTOMY     COLONOSCOPY     COLONOSCOPY WITH PROPOFOL N/A 10/07/2021   Procedure: COLONOSCOPY WITH PROPOFOL;  Surgeon: Wyline Mood, MD;  Location: [facility] ENDOSCOPY;  Service: Gastroenterology;  Laterality: N/A;   FOOT SURGERY Right    x2   LAPAROSCOPIC GASTRIC SLEEVE RESECTION N/A 01/09/2019   Procedure: LAPAROSCOPIC GASTRIC SLEEVE RESECTION, UPPER ENDO;  Surgeon: Luretha Murphy, MD;  Location: [facility] ORS;  Service: General;  Laterality: N/A;   TONSILLECTOMY AND ADENOIDECTOMY     UPPER GI ENDOSCOPY     WISDOM TOOTH EXTRACTION      OB History     Gravida  2   Para  0   Term  0   Preterm      AB      Living  2      SAB      IAB      Ectopic      Multiple      Live Births               Home Medications    Prior to Admission medications   Medication Sig Start Date End Date Taking? Authorizing Provider  cephALEXin (KEFLEX) 500 MG capsule  Take 1 capsule (500 mg total) by mouth 3 (three) times daily for 7 days. 04/14/23 04/21/23 Yes Mickie Bail, NP  acetaminophen (TYLENOL) 500 MG tablet Take 1,000 mg by mouth every 6 (six) hours as needed for moderate pain or headache.    [provider]  ALPRAZolam Prudy Feeler) 0.25 MG tablet Take 1 tablet (0.25 mg total) by mouth 2 (two) times daily as needed for anxiety. From flying 02/23/23   Sherlene Shams, MD  Brimonidine Tartrate (LUMIFY) 0.025 % SOLN Place 1 drop into both eyes daily.    [provider]  cetirizine (ZYRTEC) 10 MG tablet Take 10 mg by mouth daily as needed for allergies.    [provider]  losartan (COZAAR) 100 MG tablet Take 100 mg by mouth daily. 06/19/18   [provider]  metoprolol succinate (TOPROL-XL) 25 MG 24 hr tablet Take 25 mg by mouth daily. 06/19/18   [provider]  Multiple Vitamins-Minerals (BARIATRIC MULTIVITAMINS/IRON PO) Take 1 tablet by mouth daily.    [provider]    Family  History Family History  Problem Relation Age of Onset   Hypertension Mother    Hyperlipidemia Mother    Breast cancer Paternal Aunt    Breast cancer Paternal Aunt    Ovarian cancer Maternal Grandmother    Alcohol abuse Maternal Grandfather    Drug abuse Brother     Social History Social History   Tobacco Use   Smoking status: Never   Smokeless tobacco: Never  Vaping Use   Vaping status: Never Used  Substance Use Topics   Alcohol use: Not Currently    Comment: occasional 0-2 drinks per month   Drug use: Never     Allergies   Lisinopril   Review of Systems Review of Systems  Constitutional:  Negative for chills and fever.  Musculoskeletal:  Negative for arthralgias and joint swelling.  Skin:  Positive for wound. Negative for color change.  Neurological:  Negative for weakness and numbness.     Physical Exam Triage Vital Signs ED Triage Vitals  Encounter Vitals Group     BP      Systolic BP Percentile      Diastolic BP Percentile      Pulse      Resp      Temp      Temp src      SpO2      Weight      Height      Head Circumference      Peak Flow      Pain Score      Pain Loc      Pain Education      Exclude from Growth Chart    No data found.  Updated Vital Signs BP 121/85   Pulse 89   Temp 98 F (36.7 C)   Resp 18   SpO2 98%   Visual Acuity Right Eye Distance:   Left Eye Distance:   Bilateral Distance:    Right Eye Near:   Left Eye Near:    Bilateral Near:     Physical Exam Constitutional:      General: She is not in acute distress. HENT:     Mouth/Throat:     Mouth: Mucous membranes are moist.  Cardiovascular:     Rate and Rhythm: Normal rate and regular rhythm.  Pulmonary:     Effort: Pulmonary effort is normal. No respiratory distress.  Musculoskeletal:  General: Tenderness present. No swelling or deformity. Normal range of motion.  Skin:    General: Skin is warm and dry.     Findings: Lesion present. No erythema.      Comments: Avulsion of left thumb tip. Lacerations of left index and middle fingers. No bleeding.  See picture.   Neurological:     General: No focal deficit present.     Mental Status: She is alert and oriented to person, place, and time.     Sensory: No sensory deficit.     Motor: No weakness.  Psychiatric:        Mood and Affect: Mood normal.        Behavior: Behavior normal.      UC Treatments / Results  Labs (all labs ordered are listed, but only abnormal results are displayed) Labs Reviewed - No data to display  EKG   Radiology No results found.  Procedures Procedures (including critical care time)  Medications Ordered in UC Medications - No data to display  Initial Impression / Assessment and Plan / UC Course  I have reviewed the triage vital signs and the nursing notes.  Pertinent labs & imaging results that were available during my care of the patient were reviewed by me and considered in my medical decision making (see chart for details).    Lacerations of the left thumb, index finger, middle finger.  The injury occurred 2 days ago.  Tetanus is up-to-date.  No wound closure indicated.  Wounds cleaned and dressed with antibiotic ointment.  Treating with cephalexin.  Instructions on wound care provided.  Instructed patient to follow-up right away if she notes signs of infection.  She agrees to plan of care.  Final Clinical Impressions(s) / UC Diagnoses   Final diagnoses:  Laceration of left thumb without foreign body without damage to nail, initial encounter  Laceration of finger of left hand without foreign body without damage to nail, unspecified finger, initial encounter     Discharge Instructions      Take the cephalexin as directed.    Keep your wounds clean and dry.  Wash them gently twice a day with soap and water.  Apply an antibiotic cream twice a day.    Follow up right away if you see signs of infection, such as increased pain, redness,  pus-like drainage, warmth, fever, chills, or other concerning symptoms.         ED Prescriptions     Medication Sig Dispense Auth. Provider   cephALEXin (KEFLEX) 500 MG capsule Take 1 capsule (500 mg total) by mouth 3 (three) times daily for 7 days. 21 capsule Mickie Bail, NP      PDMP not reviewed this encounter.   Mickie Bail, NP 04/14/23 [phone]

## 2023-04-14 NOTE — ED Triage Notes (Signed)
Patient to Urgent Care with complaints of finger injuries. Reports she was cutting carrots and sliced the tips of her thumb/ 2nd and third fingers. Incident occurred on Monday.  Changing bandages twice daily and applying abx ointment.Marland Kitchen   Unsure of last TDAP.

## 2023-04-14 NOTE — Discharge Instructions (Addendum)
Take the cephalexin as directed.    Keep your wounds clean and dry.  Wash them gently twice a day with soap and water.  Apply an antibiotic cream twice a day.    Follow up right away if you see signs of infection, such as increased pain, redness, pus-like drainage, warmth, fever, chills, or other concerning symptoms.

## 2023-04-15 ENCOUNTER — Ambulatory Visit: Payer: Self-pay

## 2023-07-26 ENCOUNTER — Telehealth: Payer: Self-pay | Admitting: Plastic Surgery

## 2023-07-26 NOTE — Telephone Encounter (Signed)
Needs to be moved from 4-11, left a VM

## 2023-08-05 ENCOUNTER — Encounter (HOSPITAL_COMMUNITY): Payer: Self-pay | Admitting: *Deleted

## 2023-09-10 ENCOUNTER — Institutional Professional Consult (permissible substitution): Payer: 59 | Admitting: Plastic Surgery

## 2023-09-10 ENCOUNTER — Encounter: Payer: Self-pay | Admitting: Internal Medicine

## 2023-09-10 ENCOUNTER — Ambulatory Visit (INDEPENDENT_AMBULATORY_CARE_PROVIDER_SITE_OTHER): Payer: 59 | Admitting: Internal Medicine

## 2023-09-10 VITALS — BP 112/72 | HR 83 | Temp 97.1°F | Ht 66.0 in | Wt 169.4 lb

## 2023-09-10 DIAGNOSIS — Z0000 Encounter for general adult medical examination without abnormal findings: Secondary | ICD-10-CM | POA: Diagnosis not present

## 2023-09-10 DIAGNOSIS — R7301 Impaired fasting glucose: Secondary | ICD-10-CM | POA: Diagnosis not present

## 2023-09-10 DIAGNOSIS — E66811 Obesity, class 1: Secondary | ICD-10-CM | POA: Diagnosis not present

## 2023-09-10 DIAGNOSIS — E785 Hyperlipidemia, unspecified: Secondary | ICD-10-CM | POA: Diagnosis not present

## 2023-09-10 DIAGNOSIS — Z0001 Encounter for general adult medical examination with abnormal findings: Secondary | ICD-10-CM

## 2023-09-10 DIAGNOSIS — I10 Essential (primary) hypertension: Secondary | ICD-10-CM

## 2023-09-10 DIAGNOSIS — Z1231 Encounter for screening mammogram for malignant neoplasm of breast: Secondary | ICD-10-CM

## 2023-09-10 LAB — COMPREHENSIVE METABOLIC PANEL WITH GFR
ALT: 21 U/L (ref 0–35)
AST: 19 U/L (ref 0–37)
Albumin: 4.4 g/dL (ref 3.5–5.2)
Alkaline Phosphatase: 56 U/L (ref 39–117)
BUN: 28 mg/dL — ABNORMAL HIGH (ref 6–23)
CO2: 27 meq/L (ref 19–32)
Calcium: 9.3 mg/dL (ref 8.4–10.5)
Chloride: 103 meq/L (ref 96–112)
Creatinine, Ser: 0.76 mg/dL (ref 0.40–1.20)
GFR: 84.17 mL/min (ref >60.00)
Glucose, Bld: 88 mg/dL (ref 70–99)
Potassium: 4.3 meq/L (ref 3.5–5.1)
Sodium: 138 meq/L (ref 135–145)
Total Bilirubin: 0.6 mg/dL (ref 0.2–1.2)
Total Protein: 6.8 g/dL (ref 6.0–8.3)

## 2023-09-10 LAB — CBC WITH DIFFERENTIAL/PLATELET
Basophils Absolute: 0.1 10*3/uL (ref 0.0–0.1)
Basophils Relative: 1.8 % (ref 0.0–3.0)
Eosinophils Absolute: 0.1 10*3/uL (ref 0.0–0.7)
Eosinophils Relative: 2.3 % (ref 0.0–5.0)
HCT: 40.8 % (ref 36.0–46.0)
Hemoglobin: 13.7 g/dL (ref 12.0–15.0)
Lymphocytes Relative: 36.3 % (ref 12.0–46.0)
Lymphs Abs: 1.2 10*3/uL (ref 0.7–4.0)
MCHC: 33.5 g/dL (ref 30.0–36.0)
MCV: 89.3 fl (ref 78.0–100.0)
Monocytes Absolute: 0.4 10*3/uL (ref 0.1–1.0)
Monocytes Relative: 10.9 % (ref 3.0–12.0)
Neutro Abs: 1.7 10*3/uL (ref 1.4–7.7)
Neutrophils Relative %: 48.7 % (ref 43.0–77.0)
Platelets: 214 10*3/uL (ref 150.0–400.0)
RBC: 4.57 Mil/uL (ref 3.87–5.11)
RDW: 13.6 % (ref 11.5–15.5)
WBC: 3.4 10*3/uL — ABNORMAL LOW (ref 4.0–10.5)

## 2023-09-10 LAB — LDL CHOLESTEROL, DIRECT: Direct LDL: 115 mg/dL

## 2023-09-10 LAB — LIPID PANEL
Cholesterol: 209 mg/dL — ABNORMAL HIGH (ref 0–200)
HDL: 80.5 mg/dL (ref >39.00)
LDL Cholesterol: 117 mg/dL — ABNORMAL HIGH (ref 0–99)
NonHDL: 128.22
Total CHOL/HDL Ratio: 3
Triglycerides: 57 mg/dL (ref 0.0–149.0)
VLDL: 11.4 mg/dL (ref 0.0–40.0)

## 2023-09-10 LAB — MICROALBUMIN / CREATININE URINE RATIO
Creatinine,U: 102.4 mg/dL
Microalb Creat Ratio: UNDETERMINED mg/g (ref 0.0–30.0)
Microalb, Ur: <0.7 mg/dL

## 2023-09-10 LAB — TSH: TSH: 1.14 u[IU]/mL (ref 0.35–5.50)

## 2023-09-10 NOTE — Assessment & Plan Note (Deleted)
 WEIGHT GAIN REVIEWED ,  OCCURRED AFTER GASTRIC SLEEVE SURGERY IN 2021. PRE SURGERY WEIGHT WAS 250 TO 185  REGAINED WEIGHT IN 2023-2024.  IN [facility]  CHANGED DIET , STARTED TAKING DIETHYLOPROPRION 100 MG  PRESCRIBED BY [facility] IN [place] 2024.   HAS LOST 39 LBS SINCE LAST VISIT RESTRICTING SUGAR PRESCRIBED  AT 100 MG DAILY BY THE BARIATRIC CLINIC IN [place].  COSTING HER $150 /MONTH ,  WANTS

## 2023-09-10 NOTE — Patient Instructions (Addendum)
 YOUR MAMMOGRAM Ihas been ordered , PLEASE CALL AND GET THIS SCHEDULED  IN LATE MAY ! [facility] - call [phone]  Barbara Buckley does  the scheduling for [facility] as well        I RECOMMEND THAT YOU STOP TAKING  THE DIETHYLPROPRION.  IT IS NOT MEANT TO BE USED LONG TERM.  '  START WEANING AS FOLLOWS:    50 MG AND 25 MG FOR 2 WEEKS  THEN 25 MG AND 25 MG FOR 2 WEEKS  THEN 25 MG JUST AT NIGHT FOR 2 WEEKS

## 2023-09-10 NOTE — Progress Notes (Signed)
 Patient ID: Barbara Buckley, female    DOB: Oct 26, 1960  Age: 63 y.o. MRN: [id number]  The patient is here for annual preventive examination and management of other chronic and acute problems.   The risk factors are reflected in the social history.   The roster of all physicians providing medical care to patient - is listed in the Snapshot section of the chart.   Activities of daily living:  The patient is 100% independent in all ADLs: dressing, toileting, feeding as well as independent mobility   Home safety : The patient has smoke detectors in the home. They wear seatbelts.  There are no unsecured firearms at home. There is no violence in the home.    There is no risks for hepatitis, STDs or HIV. There is no   history of blood transfusion. They have no travel history to infectious disease endemic areas of the world.   The patient has seen their dentist in the last six month. They have seen their eye doctor in the last year. The patinet  denies slight hearing difficulty with regard to whispered voices and some television programs.  They have deferred audiologic testing in the last year.  They do not  have excessive sun exposure. Discussed the need for sun protection: hats, long sleeves and use of sunscreen if there is significant sun exposure.    Diet: the importance of a healthy diet is discussed. They do have a healthy diet.   The benefits of regular aerobic exercise were discussed. The patient  exercises  3 to 5 days per week  for  60 minutes.    Depression screen: there are no signs or vegative symptoms of depression- irritability, change in appetite, anhedonia, sadness/tearfullness.   The following portions of the patient's history were reviewed and updated as appropriate: allergies, current medications, past family history, past medical history,  past surgical history, past social history  and problem list.   Visual acuity was not assessed per patient preference since the patient has  regular follow up with an  ophthalmologist. Hearing and body mass index were assessed and reviewed.    During the course of the visit the patient was educated and counseled about appropriate screening and preventive services including : fall prevention , diabetes screening, nutrition counseling, colorectal cancer screening, and recommended immunizations.    Chief Complaint:   1) weight loss intentional:  she regained weight after her GASTRIC SLEEVE SURGERY IN 2021. PRE SURGERY WEIGHT WAS 250 and post procedure was 185  REGAINED WEIGHT IN 2023-2024.  IN [facility] SHE CHANGED DIET , STARTED TAKING DIETHYLOPROPRION 100 MG  PRESCRIBED BY [facility] IN [place] 2024.   HAS LOST 39 LBS SINCE LAST VISIT BUT THE MEDICATION IS  COSTING HER $150 /MONTH ,  WANTS me to handle medication .     Review of Symptoms  Patient denies headache, fevers, malaise, unintentional weight loss, skin rash, eye pain, sinus congestion and sinus pain, sore throat, dysphagia,  hemoptysis , cough, dyspnea, wheezing, chest pain, palpitations, orthopnea, edema, abdominal pain, nausea, melena, diarrhea, constipation, flank pain, dysuria, hematuria, urinary  Frequency, nocturia, numbness, tingling, seizures,  Focal weakness, Loss of consciousness,  Tremor, insomnia, depression, anxiety, and suicidal ideation.    Physical Exam:  BP 112/72   Pulse 83   Temp (!) 97.1 F (36.2 C) (Oral)   Ht 5\' 6"  (1.676 m)   Wt 169 lb 6.4 oz (76.8 kg)   SpO2 98%   BMI 27.34 kg/m  Physical Exam Vitals reviewed.  Constitutional:      General: She is not in acute distress.    Appearance: Normal appearance. She is normal weight. She is not ill-appearing, toxic-appearing or diaphoretic.  HENT:     Head: Normocephalic.  Eyes:     General: No scleral icterus.       Right eye: No discharge.        Left eye: No discharge.     Conjunctiva/sclera: Conjunctivae normal.  Cardiovascular:     Rate and Rhythm: Normal rate and regular rhythm.      Heart sounds: Normal heart sounds.  Pulmonary:     Effort: Pulmonary effort is normal. No respiratory distress.     Breath sounds: Normal breath sounds.  Musculoskeletal:        General: Normal range of motion.  Skin:    General: Skin is warm and dry.  Neurological:     General: No focal deficit present.     Mental Status: She is alert and oriented to person, place, and time. Mental status is at baseline.  Psychiatric:        Mood and Affect: Mood normal.        Behavior: Behavior normal.        Thought Content: Thought content normal.        Judgment: Judgment normal.    Assessment and Plan: Encounter for screening mammogram for malignant neoplasm of breast -     3D Screening Mammogram, Left and Right; Future  Essential hypertension Assessment & Plan: Well controlled on current regimen of metoprolol and losartan. Renal function is due, no changes today.  Lab Results  Component Value Date   CREATININE 0.76 09/10/2023     Orders: -     Comprehensive metabolic panel with GFR -     Microalbumin / creatinine urine ratio  Obesity (BMI 30.0-34.9) Assessment & Plan: I have advised her that this medication needs to be discontinued as she has been taking it over a year.  Weaning instructions given   Orders: -     CBC with Differential/Platelet -     TSH  Hyperlipidemia LDL goal <130 -     Lipid panel -     LDL cholesterol, direct  Impaired fasting glucose -     Comprehensive metabolic panel with GFR  Encounter for general adult medical examination with abnormal findings Assessment & Plan: age appropriate education and counseling updated, referrals for preventative services and immunizations addressed, dietary and smoking counseling addressed, most recent labs reviewed.  I have personally reviewed and have noted:   1) the patient's medical and social history 2) The pt's use of alcohol, tobacco, and illicit drugs 3) The patient's current medications and supplements 4)  Functional ability including ADL's, fall risk, home safety risk, hearing and visual impairment 5) Diet and physical activities 6) Evidence for depression or mood disorder 7) The patient's height, weight, and BMI have been recorded in the chart   I have made referrals, and provided counseling and education based on review of the above      Return in about 6 months (around 03/11/2024).  Sherlene Shams, MD

## 2023-09-12 ENCOUNTER — Encounter: Payer: Self-pay | Admitting: Internal Medicine

## 2023-09-12 NOTE — Assessment & Plan Note (Signed)
 Well controlled on current regimen of metoprolol and losartan. Renal function is due, no changes today.  Lab Results  Component Value Date   CREATININE 0.76 09/10/2023

## 2023-09-12 NOTE — Assessment & Plan Note (Signed)

## 2023-09-12 NOTE — Assessment & Plan Note (Addendum)
 I have advised her that this medication needs to be discontinued as she has been taking it over a year.  Weaning instructions given

## 2023-09-17 ENCOUNTER — Institutional Professional Consult (permissible substitution): Payer: 59 | Admitting: Plastic Surgery

## 2023-10-12 ENCOUNTER — Ambulatory Visit: Payer: 59 | Admitting: Plastic Surgery

## 2023-10-12 ENCOUNTER — Encounter: Payer: Self-pay | Admitting: Plastic Surgery

## 2023-10-12 VITALS — BP 117/79 | HR 84 | Ht 66.0 in | Wt 168.6 lb

## 2023-10-12 DIAGNOSIS — G8929 Other chronic pain: Secondary | ICD-10-CM

## 2023-10-12 DIAGNOSIS — M549 Dorsalgia, unspecified: Secondary | ICD-10-CM | POA: Insufficient documentation

## 2023-10-12 DIAGNOSIS — M546 Pain in thoracic spine: Secondary | ICD-10-CM | POA: Diagnosis not present

## 2023-10-12 DIAGNOSIS — E881 Lipodystrophy, not elsewhere classified: Secondary | ICD-10-CM | POA: Diagnosis not present

## 2023-10-12 DIAGNOSIS — Z9884 Bariatric surgery status: Secondary | ICD-10-CM

## 2023-10-12 NOTE — Progress Notes (Signed)
 Patient ID: Barbara Buckley, female    DOB: Jun 02, 1961, 63 y.o.   MRN: [id number]   Chief Complaint  Patient presents with   Advice Only    The patient is a 63 year old female here for evaluation of her arms and abdomen.  The patient underwent a gastric sleeve around 2021.  She was over 275 pounds and was able to decrease her weight to her current weight which has been stable for the last couple of years at 168 pounds.  She is 5 feet 6 inches tall.  She has had C-sections and a cholecystectomy as well as bladder tacking surgery without complications.  She is not a diabetic and is not a smoker.  She is interested in having the excess skin of her abdomen removed as well as her upper arms tightened with a brachioplasty and liposuction.  She seems to have a strong abdominal wall without a sign of hernia.    Review of Systems  Constitutional: Negative.   HENT: Negative.    Eyes: Negative.   Respiratory: Negative.    Cardiovascular: Negative.   Gastrointestinal: Negative.   Endocrine: Negative.   Genitourinary: Negative.   Musculoskeletal:  Positive for back pain and neck pain.    Past Medical History:  Diagnosis Date   Anemia    vitamin d deficiency   Anxiety    at night   CKD (chronic kidney disease), stage III (HCC)    Cystitis 10/2019   required Keflex    Dysrhythmia    svt   GERD (gastroesophageal reflux disease)    history of   History of cold sores    Hypertension    Lymphedema    Bilateral lower extremity   Migraines    history of   Obesity    OSA on CPAP    uses a cpap   PONV (postoperative nausea and vomiting)    Restless leg syndrome    Skin rash    due to stress   Tachycardia    Vitamin D deficiency     Past Surgical History:  Procedure Laterality Date   BLADDER SUSPENSION     CARDIAC CATHETERIZATION     CESAREAN SECTION     x2   CHOLECYSTECTOMY     COLONOSCOPY     COLONOSCOPY WITH PROPOFOL  N/A 10/07/2021   Procedure: COLONOSCOPY WITH  PROPOFOL ;  Surgeon: Luke Salaam, MD;  Location: [facility] ENDOSCOPY;  Service: Gastroenterology;  Laterality: N/A;   FOOT SURGERY Right    x2   LAPAROSCOPIC GASTRIC SLEEVE RESECTION N/A 01/09/2019   Procedure: LAPAROSCOPIC GASTRIC SLEEVE RESECTION, UPPER ENDO;  Surgeon: Jacolyn Matar, MD;  Location: [facility] ORS;  Service: General;  Laterality: N/A;   TONSILLECTOMY AND ADENOIDECTOMY     UPPER GI ENDOSCOPY     WISDOM TOOTH EXTRACTION        Current Outpatient Medications:    acetaminophen  (TYLENOL ) 500 MG tablet, Take 1,000 mg by mouth every 6 (six) hours as needed for moderate pain or headache., Disp: , Rfl:    Brimonidine Tartrate (LUMIFY) 0.025 % SOLN, Place 1 drop into both eyes daily., Disp: , Rfl:    cetirizine (ZYRTEC) 10 MG tablet, Take 10 mg by mouth daily as needed for allergies., Disp: , Rfl:    losartan (COZAAR) 100 MG tablet, Take 100 mg by mouth daily., Disp: , Rfl:    metoprolol  succinate (TOPROL -XL) 25 MG 24 hr tablet, Take 25 mg by mouth daily., Disp: , Rfl:    Multiple Vitamins-Minerals (  BARIATRIC MULTIVITAMINS/IRON PO), Take 1 tablet by mouth daily., Disp: , Rfl:    ALPRAZolam  (XANAX ) 0.25 MG tablet, Take 1 tablet (0.25 mg total) by mouth 2 (two) times daily as needed for anxiety. From flying (Patient not taking: Reported on 10/12/2023), Disp: 10 tablet, Rfl: 0   Objective:   Vitals:   10/12/23 1330  BP: 117/79  Pulse: 84  SpO2: 100%    Physical Exam Vitals and nursing note reviewed.  Constitutional:      Appearance: Normal appearance.  HENT:     Head: Atraumatic.  Cardiovascular:     Rate and Rhythm: Normal rate.     Pulses: Normal pulses.  Pulmonary:     Effort: Pulmonary effort is normal.  Abdominal:     General: There is no distension.     Palpations: Abdomen is soft. There is no mass.     Tenderness: There is no abdominal tenderness.  Musculoskeletal:        General: No swelling or deformity.  Skin:    General: Skin is warm.     Capillary Refill: Capillary  refill takes less than 2 seconds.     Coloration: Skin is not jaundiced.     Findings: No bruising.  Neurological:     General: No focal deficit present.     Mental Status: She is alert and oriented to person, place, and time.  Psychiatric:        Mood and Affect: Mood normal.        Behavior: Behavior normal.        Thought Content: Thought content normal.        Judgment: Judgment normal.     Assessment & Plan:  Chronic bilateral thoracic back pain  Lipodystrophy  I will send a quote for the above-mentioned and requested procedures.  We will submit the abdomen to insurance since she has been very diligent about her weight loss.  And this will give the patient time to think things over and let us  know her thoughts.  Will plan to talk after she gets the quotes.  Pictures were obtained of the patient and placed in the chart with the patient's or guardian's permission.   Lindaann Requena Tondalaya Perren, DO

## 2023-10-19 NOTE — Addendum Note (Signed)
 Addended by: Thornell Flirt on: 10/19/2023 03:56 PM   Modules accepted: Orders

## 2023-11-08 ENCOUNTER — Ambulatory Visit
Admission: RE | Admit: 2023-11-08 | Discharge: 2023-11-08 | Disposition: A | Discharge status: Home / Self Care | Source: Ambulatory Visit | Attending: Internal Medicine | Admitting: Internal Medicine

## 2023-11-08 DIAGNOSIS — Z1231 Encounter for screening mammogram for malignant neoplasm of breast: Secondary | ICD-10-CM | POA: Diagnosis present

## 2024-01-19 ENCOUNTER — Encounter: Payer: Self-pay | Admitting: Internal Medicine

## 2024-01-20 NOTE — Telephone Encounter (Signed)
 Pt requesting xanax  refill for flight

## 2024-01-21 MED ORDER — ALPRAZOLAM 0.25 MG PO TABS: 0.2500 mg | ORAL_TABLET | Freq: Two times a day (BID) | ORAL | 0 refills | Status: AC | PRN

## 2024-01-25 ENCOUNTER — Ambulatory Visit: Admitting: Internal Medicine

## 2024-02-16 ENCOUNTER — Encounter: Payer: Self-pay | Admitting: Internal Medicine

## 2024-02-16 ENCOUNTER — Ambulatory Visit (INDEPENDENT_AMBULATORY_CARE_PROVIDER_SITE_OTHER): Admitting: Internal Medicine

## 2024-02-16 ENCOUNTER — Other Ambulatory Visit: Payer: Self-pay | Admitting: Internal Medicine

## 2024-02-16 VITALS — BP 124/82 | HR 76 | Ht 66.0 in | Wt 175.6 lb

## 2024-02-16 DIAGNOSIS — R430 Anosmia: Secondary | ICD-10-CM | POA: Insufficient documentation

## 2024-02-16 DIAGNOSIS — I890 Lymphedema, not elsewhere classified: Secondary | ICD-10-CM | POA: Insufficient documentation

## 2024-02-16 DIAGNOSIS — R600 Localized edema: Secondary | ICD-10-CM | POA: Diagnosis not present

## 2024-02-16 DIAGNOSIS — I10 Essential (primary) hypertension: Secondary | ICD-10-CM

## 2024-02-16 DIAGNOSIS — E66811 Obesity, class 1: Secondary | ICD-10-CM

## 2024-02-16 DIAGNOSIS — L658 Other specified nonscarring hair loss: Secondary | ICD-10-CM | POA: Diagnosis not present

## 2024-02-16 DIAGNOSIS — N952 Postmenopausal atrophic vaginitis: Secondary | ICD-10-CM

## 2024-02-16 MED ORDER — ESTRADIOL 0.1 MG/GM VA CREA: 1.0000 | TOPICAL_CREAM | Freq: Every day | VAGINAL | 12 refills | Status: AC

## 2024-02-16 MED ORDER — SPIRONOLACTONE 25 MG PO TABS: 25.0000 mg | ORAL_TABLET | Freq: Every day | ORAL | 3 refills | Status: DC

## 2024-02-16 NOTE — Progress Notes (Unsigned)
 Subjective:  Patient ID: Barbara Buckley, female    DOB: 1960-09-21  Age: 63 y.o. MRN: [id number]  CC: There were no encounter diagnoses.   HPI Barbara Buckley presents for  Chief Complaint  Patient presents with   Medical Management of Chronic Issues    4 month follow up    Reent trip to [place]  48 hours ago .  No COVID symptoms .  Had a great trip  History of bariatric surgery: recent weight gain attributed to cruise ship for one week   Thinning ha         Outpatient Medications Prior to Visit  Medication Sig Dispense Refill   acetaminophen  (TYLENOL ) 500 MG tablet Take 1,000 mg by mouth every 6 (six) hours as needed for moderate pain or headache.     ALPRAZolam  (XANAX ) 0.25 MG tablet Take 1 tablet (0.25 mg total) by mouth 2 (two) times daily as needed for anxiety. From flying 10 tablet 0   Brimonidine Tartrate (LUMIFY) 0.025 % SOLN Place 1 drop into both eyes daily.     cetirizine (ZYRTEC) 10 MG tablet Take 10 mg by mouth daily as needed for allergies.     hydrOXYzine (ATARAX) 25 MG tablet Take 25 mg by mouth at bedtime.     losartan (COZAAR) 100 MG tablet Take 100 mg by mouth daily.     metoprolol  succinate (TOPROL -XL) 25 MG 24 hr tablet Take 25 mg by mouth daily.     Multiple Vitamins-Minerals (BARIATRIC MULTIVITAMINS/IRON PO) Take 1 tablet by mouth daily.     No facility-administered medications prior to visit.    Review of Systems;  Patient denies headache, fevers, malaise, unintentional weight loss, skin rash, eye pain, sinus congestion and sinus pain, sore throat, dysphagia,  hemoptysis , cough, dyspnea, wheezing, chest pain, palpitations, orthopnea, edema, abdominal pain, nausea, melena, diarrhea, constipation, flank pain, dysuria, hematuria, urinary  Frequency, nocturia, numbness, tingling, seizures,  Focal weakness, Loss of consciousness,  Tremor, insomnia, depression, anxiety, and suicidal ideation.      Objective:  BP 124/82   Pulse 76   Ht 5'  6 (1.676 m)   Wt 175 lb 9.6 oz (79.7 kg)   SpO2 96%   BMI 28.34 kg/m   BP Readings from Last 3 Encounters:  02/16/24 124/82  10/12/23 117/79  09/10/23 112/72    Wt Readings from Last 3 Encounters:  02/16/24 175 lb 9.6 oz (79.7 kg)  10/12/23 168 lb 9.6 oz (76.5 kg)  09/10/23 169 lb 6.4 oz (76.8 kg)    Physical Exam Vitals reviewed.  Constitutional:      General: She is not in acute distress.    Appearance: Normal appearance. She is normal weight. She is not ill-appearing, toxic-appearing or diaphoretic.  HENT:     Head: Normocephalic.  Eyes:     General: No scleral icterus.       Right eye: No discharge.        Left eye: No discharge.     Conjunctiva/sclera: Conjunctivae normal.  Cardiovascular:     Rate and Rhythm: Normal rate and regular rhythm.     Heart sounds: Normal heart sounds.  Pulmonary:     Effort: Pulmonary effort is normal. No respiratory distress.     Breath sounds: Normal breath sounds.  Musculoskeletal:        General: Normal range of motion.  Skin:    General: Skin is warm and dry.  Neurological:     General: No focal deficit present.  Mental Status: She is alert and oriented to person, place, and time. Mental status is at baseline.  Psychiatric:        Mood and Affect: Mood normal.        Behavior: Behavior normal.        Thought Content: Thought content normal.        Judgment: Judgment normal.    Lab Results  Component Value Date   HGBA1C 5.5 09/08/2022   HGBA1C 5.6 03/09/2022   HGBA1C 5.6 09/03/2021    Lab Results  Component Value Date   CREATININE 0.76 09/10/2023   CREATININE 0.77 09/08/2022   CREATININE 0.79 03/09/2022    Lab Results  Component Value Date   WBC 3.4 (L) 09/10/2023   HGB 13.7 09/10/2023   HCT 40.8 09/10/2023   PLT 214.0 09/10/2023   GLUCOSE 88 09/10/2023   CHOL 209 (H) 09/10/2023   TRIG 57.0 09/10/2023   HDL 80.50 09/10/2023   LDLDIRECT 115.0 09/10/2023   LDLCALC 117 (H) 09/10/2023   ALT 21 09/10/2023    AST 19 09/10/2023   NA 138 09/10/2023   K 4.3 09/10/2023   CL 103 09/10/2023   CREATININE 0.76 09/10/2023   BUN 28 (H) 09/10/2023   CO2 27 09/10/2023   TSH 1.14 09/10/2023   HGBA1C 5.5 09/08/2022   MICROALBUR <0.7 09/10/2023    MM 3D SCREENING MAMMOGRAM BILATERAL BREAST Result Date: 11/11/2023 CLINICAL DATA:  Screening. EXAM: DIGITAL SCREENING BILATERAL MAMMOGRAM WITH TOMOSYNTHESIS AND CAD TECHNIQUE: Bilateral screening digital craniocaudal and mediolateral oblique mammograms were obtained. Bilateral screening digital breast tomosynthesis was performed. The images were evaluated with computer-aided detection. COMPARISON:  Previous exam(s). ACR Breast Density Category b: There are scattered areas of fibroglandular density. FINDINGS: There are no findings suspicious for malignancy. IMPRESSION: No mammographic evidence of malignancy. A result letter of this screening mammogram will be mailed directly to the patient. RECOMMENDATION: Screening mammogram in one year. (Code:SM-B-01Y) BI-RADS CATEGORY  1: Negative. Electronically Signed   By: Debby Satterfield M.D.   On: 11/11/2023 14:08    Assessment & Plan:  .There are no diagnoses linked to this encounter.   I spent 34 minutes on the day of this face to face encounter reviewing patient's  most recent visit with cardiology,  nephrology,  and neurology,  prior relevant surgical and non surgical procedures, recent  labs and imaging studies, counseling on weight management,  reviewing the assessment and plan with patient, and post visit ordering and reviewing of  diagnostics and therapeutics with patient  .   Follow-up: No follow-ups on file.   Verneita LITTIE Kettering, MD

## 2024-02-16 NOTE — Patient Instructions (Signed)
 1) Estrace  cream to manage vaginal atrophy  .  Insert 1 gram intravaginally at bedtime daily for 2 weeks,  Then reduce use to twice weekly thereafter. 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

## 2024-02-16 NOTE — Assessment & Plan Note (Signed)
 Diagnosed 3-4 years ago  based on appearance,  currently managing with massage, prn wraps and leg elevation and exericise  l. The ymphedema  pumps not covered by insurance

## 2024-02-16 NOTE — Assessment & Plan Note (Signed)
 C/w/ lymphedema

## 2024-02-16 NOTE — Assessment & Plan Note (Signed)
 Chronic uses salt excessively to amplify taste of food.   First noted during trip to [place] 6-7 years ago . Has never seen ENT.  No histor yof recurrent sinus infections.  .  History of migraines prior to menopause

## 2024-02-16 NOTE — Assessment & Plan Note (Signed)
 Trial of spironolactone .  50 mg

## 2024-02-17 NOTE — Assessment & Plan Note (Signed)
 Adding Estrace  vaginal cream

## 2024-02-17 NOTE — Assessment & Plan Note (Signed)
 Well controlled on current regimen of metoprolol and losartan. Renal function is due, no changes today.  Lab Results  Component Value Date   CREATININE 0.76 09/10/2023

## 2024-02-17 NOTE — Assessment & Plan Note (Signed)
 Weight gain noted during recent [place] cruise.  Encouraged to resume exercise and low GI diet

## 2024-02-17 NOTE — Telephone Encounter (Signed)
 Note from pharmacy:   Pharmacy comment: DRUG INTERACTION BETWEEN LOSARTAN AND SPIRONOLACTONE  (INCREASED RISK OF HYPERKALEMIA). PLEASE ADVISE. THANKS.

## 2024-02-28 ENCOUNTER — Other Ambulatory Visit

## 2024-03-13 ENCOUNTER — Other Ambulatory Visit (INDEPENDENT_AMBULATORY_CARE_PROVIDER_SITE_OTHER)

## 2024-03-13 DIAGNOSIS — I890 Lymphedema, not elsewhere classified: Secondary | ICD-10-CM | POA: Diagnosis not present

## 2024-03-13 LAB — BASIC METABOLIC PANEL WITH GFR
BUN: 19 mg/dL (ref 6–23)
CO2: 26 meq/L (ref 19–32)
Calcium: 9.7 mg/dL (ref 8.4–10.5)
Chloride: 103 meq/L (ref 96–112)
Creatinine, Ser: 0.7 mg/dL (ref 0.40–1.20)
GFR: 92.57 mL/min (ref >60.00)
Glucose, Bld: 95 mg/dL (ref 70–99)
Potassium: 4.8 meq/L (ref 3.5–5.1)
Sodium: 139 meq/L (ref 135–145)

## 2024-03-14 ENCOUNTER — Ambulatory Visit: Payer: Self-pay | Admitting: Internal Medicine

## 2024-04-26 NOTE — Telephone Encounter (Signed)
 open in error

## 2024-08-21 ENCOUNTER — Encounter: Admitting: Internal Medicine
